# Patient Record
Sex: Female | Born: 1965
Health system: Southern US, Community
[De-identification: ages and names within clinical notes are randomized; demographics above are authoritative.]

## PROBLEM LIST (undated history)

## (undated) DIAGNOSIS — G319 Degenerative disease of nervous system, unspecified: Secondary | ICD-10-CM

---

## 2013-02-16 ENCOUNTER — Ambulatory Visit (INDEPENDENT_AMBULATORY_CARE_PROVIDER_SITE_OTHER): Payer: 59 | Admitting: Emergency Medicine

## 2013-02-16 VITALS — BP 160/81 | HR 89 | Temp 98.8°F | Resp 17 | Ht 71.0 in | Wt 243.0 lb

## 2013-02-16 DIAGNOSIS — Z0000 Encounter for general adult medical examination without abnormal findings: Secondary | ICD-10-CM

## 2013-02-16 LAB — POCT UA - MICROSCOPIC ONLY

## 2013-02-16 LAB — COMPREHENSIVE METABOLIC PANEL
AST: 18 U/L (ref 0–37)
Alkaline Phosphatase: 36 U/L — ABNORMAL LOW (ref 39–117)
BUN: 9 mg/dL (ref 6–23)
Calcium: 9.9 mg/dL (ref 8.4–10.5)
Chloride: 103 mEq/L (ref 96–112)
Creat: 0.91 mg/dL (ref 0.50–1.10)

## 2013-02-16 LAB — POCT CBC
Granulocyte percent: 44.9 %G (ref 37–80)
MCV: 85.7 fL (ref 80–97)
MID (cbc): 0.2 (ref 0–0.9)
MPV: 8.6 fL (ref 0–99.8)
POC MID %: 5 %M (ref 0–12)
Platelet Count, POC: 320 10*3/uL (ref 142–424)
RBC: 4.87 M/uL (ref 4.04–5.48)

## 2013-02-16 LAB — LIPID PANEL
Cholesterol: 150 mg/dL (ref 0–200)
Total CHOL/HDL Ratio: 3 Ratio
Triglycerides: 94 mg/dL (ref <150)
VLDL: 19 mg/dL (ref 0–40)

## 2013-02-16 LAB — POCT URINALYSIS DIPSTICK
Glucose, UA: NEGATIVE
Leukocytes, UA: NEGATIVE
Nitrite, UA: NEGATIVE
Urobilinogen, UA: 0.2

## 2013-02-16 NOTE — Patient Instructions (Signed)

## 2013-02-16 NOTE — Progress Notes (Signed)
[facility] [address] [place] [address] [phone]  Date:  02/16/2013   Name:  Chelsea Winters   DOB:  [birth year]   MRN:  [id number]  PCP:  No primary provider on file.    Chief Complaint: Annual Exam   History of Present Illness:  Chelsea Winters is a 47 y.o. very pleasant female patient who presents with the following:  For wellness examination.  No current medications.  No current health concerns or complaints.  Has lost 16 pounds in past 2 months on diet.  There is no problem list on file for this patient.   History reviewed. No pertinent past medical history.  History reviewed. No pertinent past surgical history.  History  Substance Use Topics  . Smoking status: Never Smoker   . Smokeless tobacco: Not on file  . Alcohol Use: No    Family History  Problem Relation Age of Onset  . Diabetes Mother   . Cancer Mother   . Asthma Father   . Alzheimer's disease Maternal Grandmother   . Cancer Paternal Grandfather     No Known Allergies  Medication list has been reviewed and updated.  No current outpatient prescriptions on file prior to visit.   No current facility-administered medications on file prior to visit.    Review of Systems:  As per HPI, otherwise negative.    Physical Examination: Filed Vitals:   02/16/13 1152  BP: 160/81  Pulse: 89  Temp: 98.8 F (37.1 C)  Resp: 17   Filed Vitals:   02/16/13 1152  Height: 5\' 11"  (1.803 m)  Weight: 243 lb (110.224 kg)   Body mass index is 33.91 kg/(m^2). Ideal Body Weight: Weight in (lb) to have BMI = 25: 178.9  GEN: WDWN, NAD, Non-toxic, A & O x 3 HEENT: Atraumatic, Normocephalic. Neck supple. No masses, No LAD. Ears and Nose: No external deformity. CV: RRR, No M/G/R. No JVD. No thrill. No extra heart sounds. PULM: CTA B, no wheezes, crackles, rhonchi. No retractions. No resp. distress. No accessory muscle use. ABD: S, NT, ND, +BS. No rebound. No HSM. EXTR: No  c/c/e NEURO Normal gait.  PSYCH: Normally interactive. Conversant. Not depressed or anxious appearing.  Calm demeanor.  Pelvic - normal external genitalia, vulva, vagina, cervix, uterus and adnexa   Assessment and Plan: Wellness examination Labs mammo PAP Follow up based on labs  Carmelina Dane, MD

## 2013-02-16 NOTE — Addendum Note (Signed)
Addended by: Johnnette Litter on: 02/16/2013 12:42 PM   Modules accepted: Orders

## 2013-02-25 ENCOUNTER — Ambulatory Visit (HOSPITAL_COMMUNITY)
Admission: RE | Admit: 2013-02-25 | Discharge: 2013-02-25 | Disposition: A | Discharge status: Home / Self Care | Payer: Managed Care, Other (non HMO) | Source: Ambulatory Visit | Attending: Emergency Medicine | Admitting: Emergency Medicine

## 2013-02-25 DIAGNOSIS — Z1231 Encounter for screening mammogram for malignant neoplasm of breast: Secondary | ICD-10-CM | POA: Insufficient documentation

## 2013-02-27 ENCOUNTER — Other Ambulatory Visit: Payer: Self-pay | Admitting: Emergency Medicine

## 2013-02-27 ENCOUNTER — Telehealth: Payer: Self-pay | Admitting: Radiology

## 2013-02-27 ENCOUNTER — Other Ambulatory Visit (INDEPENDENT_AMBULATORY_CARE_PROVIDER_SITE_OTHER): Payer: Self-pay | Admitting: Surgery

## 2013-02-27 DIAGNOSIS — R928 Other abnormal and inconclusive findings on diagnostic imaging of breast: Secondary | ICD-10-CM

## 2013-02-27 NOTE — Telephone Encounter (Signed)
done

## 2013-02-27 NOTE — Telephone Encounter (Signed)
Message copied by Caffie Damme on Wed Feb 27, 2013  9:51 AM ------      Message from: Sharion Dove      Created: Wed Feb 27, 2013  8:53 AM      Regarding: EPIC ORDER REQUEST FOR ADDITIONAL IMAGING       THIS PATIENT IS NEEDING ADDITIONAL IMAGING AT THE [facility].            PROCEDURE CODE: [id number]      DIAGNOSIS: ABNORMAL MAMMOGRAM            AND            PROCEDURE CODE: [id number]      DIAGNOSIS: ABNORMAL MAMMOGRAM            THANK YOU. ------

## 2013-03-04 ENCOUNTER — Telehealth: Payer: Self-pay | Admitting: Radiology

## 2013-03-04 DIAGNOSIS — R928 Other abnormal and inconclusive findings on diagnostic imaging of breast: Secondary | ICD-10-CM

## 2013-03-04 NOTE — Telephone Encounter (Signed)
Message copied by Caffie Damme on Mon Mar 04, 2013  1:08 PM ------      Message from: Sharion Dove      Created: Wed Feb 27, 2013 11:12 AM      Regarding: CHANGE OF EPIC ORDER REQUEST       I'VE REQUESTED THE WRONG PROCEDURE FOR THIS PATIENT. THE DIAGNOSTIC MAMMOGRAM IS CORRECT BUT THE ULTRASOUND SHOULD BE FOR THE RIGHT. HERE IS THE CORRECT PROCEDURE CODE            PROCEDURE: [id number]      DIAGNOSIS: ABNORMAL MAMMOGRAM                  THANK YOU. ------

## 2013-03-08 ENCOUNTER — Ambulatory Visit
Admission: RE | Admit: 2013-03-08 | Discharge: 2013-03-08 | Disposition: A | Discharge status: Home / Self Care | Payer: Managed Care, Other (non HMO) | Source: Ambulatory Visit | Attending: Emergency Medicine | Admitting: Emergency Medicine

## 2013-03-08 ENCOUNTER — Other Ambulatory Visit: Payer: Managed Care, Other (non HMO)

## 2013-03-08 DIAGNOSIS — R928 Other abnormal and inconclusive findings on diagnostic imaging of breast: Secondary | ICD-10-CM

## 2013-12-20 ENCOUNTER — Ambulatory Visit (INDEPENDENT_AMBULATORY_CARE_PROVIDER_SITE_OTHER): Payer: 59 | Admitting: Emergency Medicine

## 2013-12-20 VITALS — BP 144/88 | HR 92 | Temp 98.8°F | Resp 18 | Ht 70.5 in | Wt 260.0 lb

## 2013-12-20 DIAGNOSIS — Z0000 Encounter for general adult medical examination without abnormal findings: Secondary | ICD-10-CM

## 2013-12-20 LAB — POCT URINALYSIS DIPSTICK
Bilirubin, UA: NEGATIVE
Blood, UA: NEGATIVE
Glucose, UA: NEGATIVE
Ketones, UA: NEGATIVE
LEUKOCYTES UA: NEGATIVE
NITRITE UA: NEGATIVE
PH UA: 5.5
Spec Grav, UA: 1.025
UROBILINOGEN UA: 0.2

## 2013-12-20 LAB — POCT UA - MICROSCOPIC ONLY
CASTS, UR, LPF, POC: NEGATIVE
Crystals, Ur, HPF, POC: NEGATIVE
MUCUS UA: POSITIVE
YEAST UA: NEGATIVE

## 2013-12-20 NOTE — Addendum Note (Signed)
Addended by: Carollee LeitzMCFARLAND, Shahmeer Bunn L on: 12/20/2013 07:19 PM   Modules accepted: Orders

## 2013-12-20 NOTE — Progress Notes (Signed)
[facility] [address], [place] [id number] [phone]  Date:  12/20/2013   Name:  Orlando PennerBernadette Camilo   DOB:  June 10, 1966   MRN:  [id number]  PCP:  No PCP Per Patient    Chief Complaint: Annual Exam   History of Present Illness:  Orlando PennerBernadette Kimoto is a 48 y.o. very pleasant female patient who presents with the following:  For wellness exam required by employer.  No medications other than OTC vitamins.  Non smoker.  Works as a Museum/gallery exhibitions officermedical transcriptionist.  Is up to date on PAP and Mammo.  Denies other complaint or health concern today.   There are no active problems to display for this patient.   History reviewed. No pertinent past medical history.  No past surgical history on file.  History  Substance Use Topics  . Smoking status: Never Smoker   . Smokeless tobacco: Not on file  . Alcohol Use: No    Family History  Problem Relation Age of Onset  . Diabetes Mother   . Cancer Mother   . Asthma Father   . Alzheimer's disease Maternal Grandmother   . Cancer Paternal Grandfather     No Known Allergies  Medication list has been reviewed and updated.  No current outpatient prescriptions on file prior to visit.   No current facility-administered medications on file prior to visit.    Review of Systems:  As per HPI, otherwise negative.    Physical Examination: Filed Vitals:   12/20/13 1840  BP: 144/88  Pulse: 92  Temp: 98.8 F (37.1 C)  Resp: 18   Filed Vitals:   12/20/13 1840  Height: 5' 10.5" (1.791 m)  Weight: 260 lb (117.935 kg)   Body mass index is 36.77 kg/(m^2). Ideal Body Weight: Weight in (lb) to have BMI = 25: 176.4  GEN: obese, NAD, Non-toxic, A & O x 3 HEENT: Atraumatic, Normocephalic. Neck supple. No masses, No LAD. Ears and Nose: No external deformity. CV: RRR, No M/G/R. No JVD. No thrill. No extra heart sounds. PULM: CTA B, no wheezes, crackles, rhonchi. No retractions. No resp. distress. No accessory muscle  use. ABD: S, NT, ND, +BS. No rebound. No HSM. EXTR: No c/c/e NEURO Normal gait.  PSYCH: Normally interactive. Conversant. Not depressed or anxious appearing.  Calm demeanor.    Assessment and Plan: Wellness examination Weight loss   Signed,  Phillips OdorJeffery Anderson, MD

## 2013-12-21 LAB — LIPID PANEL
Cholesterol: 195 mg/dL (ref 0–200)
HDL: 64 mg/dL (ref >39)
LDL CALC: 90 mg/dL (ref 0–99)
TRIGLYCERIDES: 206 mg/dL — AB (ref <150)
Total CHOL/HDL Ratio: 3 Ratio
VLDL: 41 mg/dL — ABNORMAL HIGH (ref 0–40)

## 2013-12-21 LAB — COMPREHENSIVE METABOLIC PANEL
ALBUMIN: 4.4 g/dL (ref 3.5–5.2)
ALT: 23 U/L (ref 0–35)
AST: 25 U/L (ref 0–37)
Alkaline Phosphatase: 34 U/L — ABNORMAL LOW (ref 39–117)
BILIRUBIN TOTAL: 0.6 mg/dL (ref 0.3–1.2)
BUN: 7 mg/dL (ref 6–23)
CALCIUM: 9.6 mg/dL (ref 8.4–10.5)
CHLORIDE: 100 meq/L (ref 96–112)
CO2: 28 meq/L (ref 19–32)
Creat: 0.68 mg/dL (ref 0.50–1.10)
GLUCOSE: 91 mg/dL (ref 70–99)
Potassium: 4.5 mEq/L (ref 3.5–5.3)
SODIUM: 135 meq/L (ref 135–145)
TOTAL PROTEIN: 7.5 g/dL (ref 6.0–8.3)

## 2013-12-23 ENCOUNTER — Telehealth: Payer: Self-pay

## 2013-12-23 NOTE — Telephone Encounter (Signed)
Pt notified of cholesterol levels.

## 2013-12-23 NOTE — Telephone Encounter (Signed)
PT STATES WE CALLED AND GAVE HER HER LAB RESULTS, BUT SHE WOULD LIKE TO KNOW WHAT HER CHOL WAS. PLEASE CALL [phone]

## 2014-04-05 ENCOUNTER — Ambulatory Visit: Payer: Managed Care, Other (non HMO)

## 2014-04-05 ENCOUNTER — Ambulatory Visit: Payer: 59 | Admitting: Family Medicine

## 2014-04-05 VITALS — BP 144/86 | HR 77 | Temp 98.4°F | Resp 16 | Ht 70.5 in | Wt 259.0 lb

## 2014-04-05 DIAGNOSIS — R0689 Other abnormalities of breathing: Principal | ICD-10-CM

## 2014-04-05 DIAGNOSIS — R0609 Other forms of dyspnea: Secondary | ICD-10-CM

## 2014-04-05 DIAGNOSIS — R0600 Dyspnea, unspecified: Secondary | ICD-10-CM

## 2014-04-05 DIAGNOSIS — J309 Allergic rhinitis, unspecified: Secondary | ICD-10-CM

## 2014-04-05 DIAGNOSIS — R0989 Other specified symptoms and signs involving the circulatory and respiratory systems: Secondary | ICD-10-CM

## 2014-04-05 MED ORDER — FLUTICASONE PROPIONATE 50 MCG/ACT NA SUSP: 2.0000 | Freq: Every day | NASAL | Status: AC

## 2014-04-05 NOTE — Patient Instructions (Signed)
Allergic Rhinitis Allergic rhinitis is when the mucous membranes in the nose respond to allergens. Allergens are particles in the air that cause your body to have an allergic reaction. This causes you to release allergic antibodies. Through a chain of events, these eventually cause you to release histamine into the blood stream. Although meant to protect the body, it is this release of histamine that causes your discomfort, such as frequent sneezing, congestion, and an itchy, runny nose.  CAUSES  Seasonal allergic rhinitis (hay fever) is caused by pollen allergens that may come from grasses, trees, and weeds. Year-round allergic rhinitis (perennial allergic rhinitis) is caused by allergens such as house dust mites, pet dander, and mold spores.  SYMPTOMS   Nasal stuffiness (congestion).  Itchy, runny nose with sneezing and tearing of the eyes. DIAGNOSIS  Your health care provider can help you determine the allergen or allergens that trigger your symptoms. If you and your health care provider are unable to determine the allergen, skin or blood testing may be used. TREATMENT  Allergic Rhinitis does not have a cure, but it can be controlled by:  Medicines and allergy shots (immunotherapy).  Avoiding the allergen. Hay fever may often be treated with antihistamines in pill or nasal spray forms. Antihistamines block the effects of histamine. There are over-the-counter medicines that may help with nasal congestion and swelling around the eyes. Check with your health care provider before taking or giving this medicine.  If avoiding the allergen or the medicine prescribed do not work, there are many new medicines your health care provider can prescribe. Stronger medicine may be used if initial measures are ineffective. Desensitizing injections can be used if medicine and avoidance does not work. Desensitization is when a patient is given ongoing shots until the body becomes less sensitive to the allergen.  Make sure you follow up with your health care provider if problems continue. HOME CARE INSTRUCTIONS It is not possible to completely avoid allergens, but you can reduce your symptoms by taking steps to limit your exposure to them. It helps to know exactly what you are allergic to so that you can avoid your specific triggers. SEEK MEDICAL CARE IF:   You have a fever.  You develop a cough that does not stop easily (persistent).  You have shortness of breath.  You start wheezing.  Symptoms interfere with normal daily activities. Document Released: 08/30/2001 Document Revised: 09/25/2013 Document Reviewed: 08/12/2013 ExitCare Patient Information 2014 [employer].  

## 2014-04-05 NOTE — Progress Notes (Addendum)
° °  Subjective:   This chart was scribed for Chelsea SidleKurt Lauenstein, MD by Arlan OrganAshley Leger, [facility] Sc[facility]. This patient was seen in room 3 and the patient's care was started 10:21 AM.    Patient ID: Chelsea Winters, female    DOB: [birth year], 48 y.o.   MRN: [id number]  Chief Complaint  Patient presents with   Shortness of Breath    x1 week worse today    nervous feeling   Diarrhea    HPI  HPI Comments: Chelsea Winters is a 48 y.o. female who [facility] complaining of intermittent shortness of breath x 1 week that has progressively worsened today. She also reports anxiety, diarrhea, "feeling constricted", and a "nervous feeling". States she has recently started working out, but says she still noted "not being able to get a complete breath" prior to starting her low impact cardio routines. Pt states she was treated for Asthma in 1997 and has not needed her inhaler since. Denies currently being a smoker. At this time she denies any wheezing, fever, chills, or weakness. No pertinent medical history. No other concerns this visit.  She currently has two children ages 8615 and 4317 Pt works as a Museum/gallery exhibitions officermedical transcriptionist    History reviewed. No pertinent past medical history.  History   Social History   Marital Status: Single    Spouse Name: N/A    Number of Children: N/A   Years of Education: N/A   Occupational History   Not on file.   Social History Main Topics   Smoking status: Never Smoker    Smokeless tobacco: Not on file   Alcohol Use: No   Drug Use: No   Sexual Activity: No   Other Topics Concern   Not on file   Social History Narrative   No narrative on file    Review of Systems  Constitutional: Negative for fever and chills.  HENT: Negative for congestion.   Eyes: Negative for redness.  Respiratory: Positive for shortness of breath. Negative for cough.   Gastrointestinal: Positive for diarrhea.  Skin: Negative  for rash.  Psychiatric/Behavioral: Negative for confusion. The patient is nervous/anxious.        Objective:   Physical Exam  Nursing note and vitals reviewed. Constitutional: She is oriented to person, place, and time. She appears well-developed and well-nourished.  HENT:  Head: Normocephalic and atraumatic.  Nose: Mucosal edema present.  Eyes: EOM are normal.  Neck: Normal range of motion.  Cardiovascular: Normal rate.   Pulmonary/Chest: Effort normal.  Musculoskeletal: Normal range of motion.  Neurological: She is alert and oriented to person, place, and time.  Skin: Skin is warm and dry.  Psychiatric: She has a normal mood and affect. Her behavior is normal.   EKG: Normal sinus rhythm  Pulmonary function:  normal    [facility] reading (PRIMARY) by  Dr. Milus GlazierLauenstein  CXR:  neg.   Assessment & Plan:   I personally performed the services described in this documentation, which was scribed in my presence. The recorded information has been reviewed and is accurate.  Dyspnea and respiratory abnormality - Plan: DG Chest 2 View, EKG 12-Lead, EKG 12-Lead, Pulmonary Function Test, Pulmonary Function Test, fluticasone (FLONASE) 50 MCG/ACT nasal spray  Allergic rhinitis - Plan: fluticasone (FLONASE) 50 MCG/ACT nasal spray  Signed, Chelsea SidleKurt Lauenstein, MD

## 2014-07-03 ENCOUNTER — Ambulatory Visit (INDEPENDENT_AMBULATORY_CARE_PROVIDER_SITE_OTHER): Payer: 59

## 2014-07-03 ENCOUNTER — Ambulatory Visit (INDEPENDENT_AMBULATORY_CARE_PROVIDER_SITE_OTHER): Payer: 59 | Admitting: Family Medicine

## 2014-07-03 ENCOUNTER — Encounter: Payer: Self-pay | Admitting: Family Medicine

## 2014-07-03 VITALS — BP 130/80 | HR 72 | Temp 98.4°F | Resp 16 | Ht 70.0 in | Wt 250.8 lb

## 2014-07-03 DIAGNOSIS — M25519 Pain in unspecified shoulder: Secondary | ICD-10-CM

## 2014-07-03 DIAGNOSIS — R202 Paresthesia of skin: Secondary | ICD-10-CM

## 2014-07-03 DIAGNOSIS — M62830 Muscle spasm of back: Secondary | ICD-10-CM

## 2014-07-03 DIAGNOSIS — R209 Unspecified disturbances of skin sensation: Secondary | ICD-10-CM

## 2014-07-03 DIAGNOSIS — M5380 Other specified dorsopathies, site unspecified: Secondary | ICD-10-CM

## 2014-07-03 DIAGNOSIS — M25511 Pain in right shoulder: Secondary | ICD-10-CM

## 2014-07-03 DIAGNOSIS — E669 Obesity, unspecified: Secondary | ICD-10-CM | POA: Insufficient documentation

## 2014-07-03 MED ORDER — TRAMADOL-ACETAMINOPHEN 37.5-325 MG PO TABS: 1.0000 | ORAL_TABLET | Freq: Four times a day (QID) | ORAL | Status: DC | PRN

## 2014-07-03 MED ORDER — CYCLOBENZAPRINE HCL 5 MG PO TABS: 5.0000 mg | ORAL_TABLET | Freq: Three times a day (TID) | ORAL | Status: DC | PRN

## 2014-07-03 NOTE — Progress Notes (Signed)
Subjective:    Patient ID: Chelsea Winters, female    DOB: 1966/09/15, 48 y.o.   MRN: [id number]  HPI  This 48 y.o. AA female is here for evaluation of R shoulder and mid-back pain. She reports sudden onset and denies trauma. Pt is R-hand dominant. 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 Pain is relieved when her R arm is hanging at her side. She does not have weakness or decreased grip on R side. She denies neck or back pain or trauma. OTC NSAIDs and heat have provided temporary relief.  PMHx, Surg Hx, Soc and Fam Hx reviewed.   Review of Systems  Constitutional: Negative.   Respiratory: Negative.   Cardiovascular: Negative.   Musculoskeletal: Positive for arthralgias. Negative for joint swelling, myalgias, neck pain and neck stiffness.  Neurological: Negative.   Psychiatric/Behavioral: Negative.       Objective:   Physical Exam  Nursing note and vitals reviewed. Constitutional: She is oriented to person, place, and time. She appears well-developed and well-nourished. No distress.  HENT:  Head: Normocephalic and atraumatic.  Right Ear: External ear normal.  Left Ear: External ear normal.  Nose: Nose normal.  Mouth/Throat: Oropharynx is clear and moist.  Eyes: Conjunctivae and EOM are normal. Pupils are equal, round, and reactive to light. No scleral icterus.  Neck: Normal range of motion. Neck supple. No thyromegaly present.  Cardiovascular: Normal rate, regular rhythm and normal heart sounds.  Exam reveals no gallop and no friction rub.   No murmur heard. Pulmonary/Chest: Effort normal and breath sounds normal. No respiratory distress.  Musculoskeletal:       Right shoulder: She exhibits tenderness and pain. She exhibits normal range of motion, no bony tenderness, no effusion, no crepitus, no deformity and normal strength.       Left shoulder: Normal.       Cervical back:  Normal.       Thoracic back: Normal.  Neurological: She is alert and oriented to person, place, and time. She has normal strength. She displays no atrophy. No cranial nerve deficit or sensory deficit. She exhibits normal muscle tone. Coordination and gait normal.  Reflex Scores:      Tricep reflexes are 1+ on the right side and 1+ on the left side.      Bicep reflexes are 1+ on the right side and 1+ on the left side.      Brachioradialis reflexes are 1+ on the right side and 1+ on the left side. Skin: Skin is warm, dry and intact. No rash noted. She is not diaphoretic. No erythema.  Psychiatric: She has a normal mood and affect. Her behavior is normal. Judgment and thought content normal.    [facility] reading (PRIMARY) by  Dr. Audria Nine: Thoracic spine- early degenerative changes and scoliosis vs. pt rotated. No bony abnormalities, fracture or dislocation.     Assessment & Plan:  Pain in joint, shoulder region, right - Plan: DG Thoracic Spine 2 View, Ambulatory referral to Physical Therapy  Spasm of back muscles - Plan: Ambulatory referral to Physical Therapy  Paresthesia of right arm  Meds ordered this encounter  . cyclobenzaprine (FLEXERIL) 5 MG tablet    Sig: Take 1 tablet (5 mg total) by mouth 3 (three) times daily as needed for muscle spasms.    Dispense:  30 tablet    Refill:  1  . traMADol-acetaminophen (ULTRACET) 37.5-325 MG per tablet  Sig: Take 1 tablet by mouth every 6 (six) hours as needed.    Dispense:  50 tablet    Refill:  0

## 2014-07-03 NOTE — Patient Instructions (Signed)

## 2014-07-08 ENCOUNTER — Ambulatory Visit (INDEPENDENT_AMBULATORY_CARE_PROVIDER_SITE_OTHER): Payer: Managed Care, Other (non HMO)

## 2014-07-08 ENCOUNTER — Encounter: Payer: Self-pay | Admitting: Family Medicine

## 2014-07-08 ENCOUNTER — Ambulatory Visit (INDEPENDENT_AMBULATORY_CARE_PROVIDER_SITE_OTHER): Payer: Managed Care, Other (non HMO) | Admitting: Family Medicine

## 2014-07-08 VITALS — BP 130/80 | HR 79 | Temp 99.0°F | Resp 16 | Ht 70.5 in | Wt 253.2 lb

## 2014-07-08 DIAGNOSIS — M25519 Pain in unspecified shoulder: Secondary | ICD-10-CM

## 2014-07-08 DIAGNOSIS — M25511 Pain in right shoulder: Secondary | ICD-10-CM

## 2014-07-08 DIAGNOSIS — R209 Unspecified disturbances of skin sensation: Secondary | ICD-10-CM

## 2014-07-08 DIAGNOSIS — M5030 Other cervical disc degeneration, unspecified cervical region: Secondary | ICD-10-CM

## 2014-07-08 DIAGNOSIS — IMO0001 Reserved for inherently not codable concepts without codable children: Secondary | ICD-10-CM

## 2014-07-08 NOTE — Patient Instructions (Signed)
Cervical Radiculopathy °Cervical radiculopathy means a nerve in the neck is pinched or bruised. This can cause pain or loss of feeling (numbness) that runs from your neck to your arm and fingers. °HOME CARE  °· Put ice on the injured or painful area. °¨ Put ice in a plastic bag. °¨ Place a towel between your skin and the bag. °¨ Leave the ice on for 15-20 minutes, 03-04 times a day, or as told by your doctor. °· If ice does not help, you can try using heat. Take a warm shower or bath, or use a hot water bottle as told by your doctor. °· You may try a gentle neck and shoulder massage. °· Use a flat pillow when you sleep. °· Only take medicines as told by your doctor. °· Keep all physical therapy visits as told by your doctor. °· If you are given a soft collar, wear it as told by your doctor. °GET HELP RIGHT AWAY IF:  °· Your pain gets worse and is not controlled with medicine. °· You lose feeling or feel weak in your hand, arm, face, or leg. °· You have a fever or stiff neck. °· You cannot control when you poop or pee (incontinence). °· You have trouble with walking, balance, or speaking. °MAKE SURE YOU:  °· Understand these instructions. °· Will watch your condition. °· Will get help right away if you are not doing well or get worse. °Document Released: 11/24/2011 Document Revised: 02/27/2012 Document Reviewed: 11/24/2011 °ExitCare® Patient Information ©2015 [employer]. This information is not intended to replace advice given to you by your health care provider. Make sure you discuss any questions you have with your health care provider. ° °

## 2014-07-08 NOTE — Progress Notes (Signed)
S;  This 48 y.o. AA female is here for follow-up re: R shoulder region pain w/ paresthesias down back of R arm. She has been taking medications as prescribed and was pain free last night. Topical heat has been effective. 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 She has not started PT yet but did get a phone call; she will be setting up appt for evaluation and treatment.  Patient Active Problem List   Diagnosis Date Noted  . Pain in joint, shoulder region 07/03/2014  . Obesity (BMI 35.0-39.9 without comorbidity) 07/03/2014   Prior to Admission medications   Medication Sig Start Date End Date Taking? Authorizing Provider  cyclobenzaprine (FLEXERIL) 5 MG tablet Take 1 tablet (5 mg total) by mouth 3 (three) times daily as needed for muscle spasms. 07/03/14  Yes Maurice MarchBarbara B Teagon Kron, MD  esomeprazole (NEXIUM) 20 MG capsule Take 20 mg by mouth daily at 12 noon.   Yes Historical Provider, MD  fluticasone (FLONASE) 50 MCG/ACT nasal spray Place 2 sprays into both nostrils daily. 04/05/14  Yes Elvina SidleKurt Lauenstein, MD  traMADol-acetaminophen (ULTRACET) 37.5-325 MG per tablet Take 1 tablet by mouth every 6 (six) hours as needed. 07/03/14  Yes Maurice MarchBarbara B Eryn Krejci, MD  b complex vitamins tablet Take 1 tablet by mouth daily.    Historical Provider, MD  glucosamine-chondroitin 500-400 MG tablet Take 1 tablet by mouth 3 (three) times daily.    Historical Provider, MD  loratadine-pseudoephedrine (CLARITIN-D 12-HOUR) 5-120 MG per tablet Take 1 tablet by mouth 2 (two) times daily.    Historical Provider, MD  Multiple Vitamin (MULTIVITAMIN) capsule Take 1 capsule by mouth daily.    Historical Provider, MD  OVER THE COUNTER MEDICATION     Historical Provider, MD  OVER THE COUNTER MEDICATION 1,000 mg 2 (two) times daily before lunch and supper.    Historical Provider, MD    ROS: As per HPI. She notes tightness in R side of neck with flexion and rotation.   O: Filed Vitals:   07/08/14 1338   BP: 130/80  Pulse: 79  Temp: 99 F (37.2 C)  Resp: 16   GEN: In NAD; WN,WD. HENT: [place]/AT; EOMI w/ clear conj/sclerae. Otherwise unremarkable. NECK: Decreased ROM. Tender posterior muscle group on R. COR: RRR. LUNGS: Unlabored resp. MS: Normal ROM in shoulders. No change in exam compared to 07/03/2014. NEURO: A&O x 3; CNs intact. Gait is normal. Nonfocal.   [facility] reading (PRIMARY) by  Dr. Audria NineMcPherson: Cervical spine-  Loss of normal lordotic curve; degenerative changes at C4-C5 and less so at C5-C6. No fracture of dislocation.   A/P: Pain in joint, shoulder region, right - PT evaluation and treatment plan pending. Plan: DG Cervical Spine 2 or 3 views  Paresthesias/numbness - Plan: DG Cervical Spine 2 or 3 views  DDD (degenerative disc disease), cervical- Continue current medications and heat.  Pt given work excuse for 1-2 weeks; she will return to clinic in 3 weeks or sooner prn.

## 2014-07-17 ENCOUNTER — Other Ambulatory Visit: Payer: Self-pay | Admitting: Family Medicine

## 2014-07-19 NOTE — Telephone Encounter (Signed)
Tramadol- APAP refill phoned to pt's pharmacy. 

## 2014-07-21 ENCOUNTER — Telehealth: Payer: Self-pay

## 2014-07-21 NOTE — Telephone Encounter (Signed)
Pt needs Dr Audria NineMcPherson to extend her work excuse till 07-31-14. It expires 07-22-14. If someone could call her when it is done. She needs it asap due to it expiring tomorrow. [phone]. Thank you

## 2014-07-22 ENCOUNTER — Telehealth: Payer: Self-pay

## 2014-07-22 ENCOUNTER — Encounter: Payer: Self-pay | Admitting: Family Medicine

## 2014-07-22 NOTE — Telephone Encounter (Signed)
Called her to advise.  

## 2014-07-22 NOTE — Telephone Encounter (Signed)
A new letter has been printed for pt and is ready for pick-up at [facility]. Please contact pt about this work note.

## 2014-07-22 NOTE — Telephone Encounter (Signed)
Pt requesting OOW for another 10 days.

## 2014-07-22 NOTE — Telephone Encounter (Signed)
Pt dropped off FMLA ppw for Dr. Audria NineMcPherson to be completed in 5-7 business days

## 2014-07-29 NOTE — Telephone Encounter (Signed)
FMLA form completed and returned to Disability Desk on Monday, 07/28/14.

## 2014-07-31 ENCOUNTER — Ambulatory Visit (INDEPENDENT_AMBULATORY_CARE_PROVIDER_SITE_OTHER): Payer: Managed Care, Other (non HMO) | Admitting: Family Medicine

## 2014-07-31 ENCOUNTER — Encounter: Payer: Self-pay | Admitting: Family Medicine

## 2014-07-31 VITALS — BP 134/76 | HR 74 | Temp 99.1°F | Resp 16 | Ht 70.0 in | Wt 246.0 lb

## 2014-07-31 DIAGNOSIS — M5030 Other cervical disc degeneration, unspecified cervical region: Secondary | ICD-10-CM

## 2014-07-31 NOTE — Progress Notes (Signed)
S:  This 48 y.o. AA female returns for re- evaluation of neck and R shoulder pain with paresthesia. She is using less medication but still has decreased ROM. She has numbness when her R arm is above waist level. Numbness occurs when she is trying to drive or floss her teeth. 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 Medication is effective but cause drowsiness; her supervisor does not want her trying to transcribe if she is under the influence of prescription medication. Pt has been going to PT; it seems to be effective.  Patient Active Problem List   Diagnosis Date Noted  . DDD (degenerative disc disease), cervical 07/08/2014  . Pain in joint, shoulder region 07/03/2014  . Obesity (BMI 35.0-39.9 without comorbidity) 07/03/2014    Prior to Admission medications   Medication Sig Start Date End Date Taking? Authorizing Provider  b complex vitamins tablet Take 1 tablet by mouth daily.   Yes Historical Provider, MD  cyclobenzaprine (FLEXERIL) 5 MG tablet Take 1 tablet (5 mg total) by mouth 3 (three) times daily as needed for muscle spasms. 07/03/14  Yes Maurice March, MD  fluticasone [facility]) 50 MCG/ACT nasal spray Place 2 sprays into both nostrils daily. 04/05/14  Yes Elvina Sidle, MD  glucosamine-chondroitin 500-400 MG tablet Take 1 tablet by mouth 3 (three) times daily.   Yes Historical Provider, MD  loratadine-pseudoephedrine (CLARITIN-D 12-HOUR) 5-120 MG per tablet Take 1 tablet by mouth 2 (two) times daily.   Yes Historical Provider, MD  Multiple Vitamin (MULTIVITAMIN) capsule Take 1 capsule by mouth daily.   Yes Historical Provider, MD  OVER THE COUNTER MEDICATION    Yes Historical Provider, MD  OVER THE COUNTER MEDICATION 1,000 mg 2 (two) times daily before lunch and supper.   Yes Historical Provider, MD  traMADol-acetaminophen (ULTRACET) 37.5-325 MG per tablet TAKE 1 TABLET BY MOUTH EVERY 6 HOURS AS NEEDED    Yes Maurice March, MD    No Known Allergies   History reviewed. No pertinent past surgical history.   Family History  Problem Relation Age of Onset  . Diabetes Mother   . Cancer Mother   . Asthma Father   . Alzheimer's disease Maternal Grandmother   . Cancer Paternal Grandfather     History   Social History  . Marital Status: Single    Spouse Name: N/A    Number of Children: N/A  . Years of Education: N/A   Occupational History  . Not on file.   Social History Main Topics  . Smoking status: Never Smoker   . Smokeless tobacco: Not on file  . Alcohol Use: No  . Drug Use: No  . Sexual Activity: No   Other Topics Concern  . Not on file   Social History Narrative  . No narrative on file    O: Filed Vitals:   07/31/14 1148  BP: 134/76  Pulse: 74  Temp: 99.1 F (37.3 C)  Resp: 16   GEN: In NAD; WN,WD. HENT: [place]/AT; EOMI w/ clear conj/sclerae. Otherwise unremarkable. NECK: Spasms. (Reviewed C-spine films w/ pt). COR: RRR. LUNGS: Normal resp rate and effort. SKIN: W&D; intact w/o diaphoresis or erythema. MS: MAEs; no deformities or muscle atrophy. NEURO: A&O x 3; CNs intact. Gait is normal. Nonfocal.  A/P: DDD (degenerative disc disease), cervical - Plan: MR Cervical Spine Wo Contrast Continue medication and PT; discussed referral to specialist if warranted. Pt  to remain out of work for now. FMLA papers have been completed and pt to pick them up from 102 today.

## 2014-08-01 DIAGNOSIS — Z0271 Encounter for disability determination: Secondary | ICD-10-CM

## 2014-08-04 ENCOUNTER — Telehealth: Payer: Self-pay | Admitting: Family Medicine

## 2014-08-04 NOTE — Telephone Encounter (Signed)
I have the FMLA forms, I had advised patient the ones we received were STD forms but we still needed the FMLA forms. Patient was aware when she picked up the STD forms we still needed the FMLA. I have them now, and have began filling them out, but have questions for Dr Audria NineMcPherson, will ask her tomorrow and let patient know when they are ready.  Dr Audria NineMcPherson, you have her out until 07/31/14, do you want to allow for additional time off for flare ups on the FMLA forms? They are on your desk with a note. See me if you have questions. ( I have also printed the STD forms you previously filled out.

## 2014-08-04 NOTE — Telephone Encounter (Signed)
Patient came to walk in center at 102 stating that her FMLA form was not completed. States that almost 2 weeks ago she brought in a short term disability form and an FMLA form to be done by Dr. Audria NineMcPherson. States that she picked the ppw up "around the 9th" and did not realize until today that her FMLA was not completed. Patient is asking for this FMLA form to be expedited if possible because her job is dependent on this. Informed patient that I will deliver her FMLA form to Dr. Audria NineMcPherson and either myself or Leotis ShamesLauren will call when this is ready for pick up.

## 2014-08-09 ENCOUNTER — Ambulatory Visit
Admission: RE | Admit: 2014-08-09 | Discharge: 2014-08-09 | Disposition: A | Discharge status: Home / Self Care | Payer: Managed Care, Other (non HMO) | Source: Ambulatory Visit | Attending: Family Medicine | Admitting: Family Medicine

## 2014-08-09 DIAGNOSIS — M5030 Other cervical disc degeneration, unspecified cervical region: Secondary | ICD-10-CM

## 2014-08-13 ENCOUNTER — Telehealth: Payer: Self-pay | Admitting: Family Medicine

## 2014-08-13 DIAGNOSIS — M4802 Spinal stenosis, cervical region: Secondary | ICD-10-CM

## 2014-08-13 DIAGNOSIS — M5030 Other cervical disc degeneration, unspecified cervical region: Secondary | ICD-10-CM

## 2014-08-13 NOTE — Telephone Encounter (Signed)
I have completed FMLA paperwork. (I explained to pt that I was out of office last week). FMLA should be returned to 102 on 08/14/2014.  I spoke with pt tonight about MRI result; order placed for neurosurgeon evaluation. Pt understands.

## 2014-08-13 NOTE — Telephone Encounter (Signed)
I spoke with pt re: MRI report. She agrees to referral to neurosurgeon for further evaluation. She will continue PT and home exercises. Pt would like copy of MRI report mailed to her.  FMLA form is complete as of 08/13/2014. To be returned to [address] building.

## 2014-08-14 ENCOUNTER — Encounter: Payer: Self-pay | Admitting: Family Medicine

## 2014-08-14 NOTE — Telephone Encounter (Signed)
Called patient to inform her that her paperwork has been completed and she may pick those up from 102. Patient has also requested a copy of her MRI results? Is it possible she can get those when she comes to get her FMLA forms?   Thanks, [employer]

## 2014-08-14 NOTE — Telephone Encounter (Signed)
Printed off a copy of MRI results and put with FMLA paperwork for pick up.

## 2014-08-27 ENCOUNTER — Telehealth: Payer: Self-pay

## 2014-08-27 NOTE — Telephone Encounter (Signed)
Patient has appt with Neurosurgeon today, I have written note for her outlining her restrictions, will have Dr Audria Nine sign. Printed MRI report, placed with the medical records you provided.

## 2014-08-27 NOTE — Telephone Encounter (Signed)
Pt came in 08/26/14, needed medical records from 07/03/2014 to present and a letter from Dr. Audria Nine stating her restrictions and limitations. Release signed by patient. Records printed and given to Amy at [employer] with instructions for letter needed by Dr. Audria Nine. Pt to be called when completed and she will pick up. [name]

## 2014-08-28 ENCOUNTER — Telehealth: Payer: Self-pay

## 2014-08-28 NOTE — Telephone Encounter (Signed)
Letter and records ready for pickup, tried to call patient-no answer and no voicemail option.

## 2014-08-28 NOTE — Telephone Encounter (Signed)
Left voicemail for patient to pickup letter and medical records she requested, in pickup drawer.

## 2014-08-28 NOTE — Telephone Encounter (Signed)
I put this in your chair. I did not know if anything else needs to be done. I have called patient to pick up, but did advise her to wait until this afternoon in case anything else is needed.

## 2014-08-28 NOTE — Telephone Encounter (Signed)
[employer] sent paperwork for pt's FMLA needing additional information. Sent this Via inner office mail for completion in 5-7 business days. Please return to disability box at 102, next to check out.

## 2014-08-29 NOTE — Telephone Encounter (Signed)
I received [employer] form today; will complete and return to Disability box at 102 by Monday.

## 2014-09-01 NOTE — Telephone Encounter (Signed)
[employer] form completed today (09/01/14) and returned to [employer] Disability box.

## 2014-09-02 NOTE — Telephone Encounter (Signed)
pts additional information faxed to  [employer]

## 2015-01-22 ENCOUNTER — Ambulatory Visit: Payer: Medicaid Other | Attending: Physical Medicine and Rehabilitation

## 2015-01-22 DIAGNOSIS — M542 Cervicalgia: Secondary | ICD-10-CM | POA: Insufficient documentation

## 2015-01-22 DIAGNOSIS — M5410 Radiculopathy, site unspecified: Secondary | ICD-10-CM | POA: Insufficient documentation

## 2015-01-29 ENCOUNTER — Ambulatory Visit: Payer: Medicaid Other | Admitting: Physical Therapy

## 2015-01-29 DIAGNOSIS — M542 Cervicalgia: Secondary | ICD-10-CM | POA: Diagnosis not present

## 2015-02-09 ENCOUNTER — Ambulatory Visit: Payer: Medicaid Other

## 2015-02-09 DIAGNOSIS — M542 Cervicalgia: Secondary | ICD-10-CM

## 2015-02-09 DIAGNOSIS — M5410 Radiculopathy, site unspecified: Secondary | ICD-10-CM

## 2015-02-09 DIAGNOSIS — R6889 Other general symptoms and signs: Secondary | ICD-10-CM

## 2015-02-09 NOTE — Therapy (Signed)
[facility] [address] [place], [place] [address] [place], [place], [address] Phone: [phone]   Fax:  [phone]  Physical Therapy Treatment  Patient Details  Name: Chelsea Winters MRN: [id number] Date of Birth: [birth year] Referring Provider:  Claria Dice, MD  Encounter Date: 02/09/2015      PT End of Session - 02/09/15 1138    Visit Number 3   Date for PT Re-Evaluation 02/19/15   PT Start Time 1105   PT Stop Time 1203   PT Time Calculation (min) 58 min   Activity Tolerance Patient tolerated treatment well   Behavior During Therapy [facility] for tasks assessed/performed      History reviewed. No pertinent past medical history.  History reviewed. No pertinent past surgical history.  There were no vitals taken for this visit.  Visit Diagnosis:  Neck pain, bilateral  Radiculopathy of arm  Activity intolerance      Subjective Assessment - 02/09/15 1106    Symptoms Pt reports continue Rt UE pain.  Central neck pain with supine cervical retraction exercises.   Currently in Pain? Yes   Pain Score 7    Pain Location Arm   Pain Type Neuropathic pain   Pain Radiating Towards Rt elbow   Pain Onset More than a month ago   Pain Frequency Intermittent   Aggravating Factors  use of the Rt UE to open a bottle, computer use   Pain Relieving Factors stretch it out, ER of the Rt arm to behind the head, heat   Effect of Pain on Daily Activities not able to work    Multiple Pain Sites Yes   Pain Score 6   Pain Type Acute pain   Pain Location Neck   Pain Orientation Left;Right   Pain Descriptors / Indicators Constant;Aching   Pain Frequency Intermittent   Pain Onset Progressive          [facility] PT Assessment - 02/09/15 0001    Assessment   Medical Diagnosis neck pain   Onset Date 07/02/15   Next MD Visit 03/11/15   Precautions   Precautions None   Balance Screen   Has the patient fallen in the past 6 months No   Has the patient had a  decrease in activity level because of a fear of falling?  No   Is the patient reluctant to leave their home because of a fear of falling?  No   Home Environment   Living Enviornment Private residence   Prior Function   Level of Independence Independent with basic ADLs                  [facility] Adult PT Treatment/Exercise - 02/09/15 0001    Modalities   Modalities Electrical Stimulation;Moist Heat;Traction   Moist Heat Therapy   Number Minutes Moist Heat 15 Minutes   Moist Heat Location --  neck   Electrical Stimulation   Electrical Stimulation Location Rt neck and scapula   Electrical Stimulation Action interferential   Electrical Stimulation Parameters 15 minutes   Electrical Stimulation Goals Pain   Traction   Type of Traction Cervical   Min (lbs) 5   Max (lbs) 16   Hold Time 60   Rest Time 10   Time 15   Manual Therapy   Manual Therapy Myofascial release   Myofascial Release soft tissue elongation to Rt>Lt cervical paraspinals with passive rotation and C4-5 rotational mobs.  PT Long Term Goals - 02/09/15 1111    PT LONG TERM GOAL #3   Title report a 30% reduction in Rt UE pain to allow for computer use   Status On-going  No change in Rt UE pain               Plan - 02/09/15 1139    Clinical Impression Statement Pt with only 3 sessions of PT with focus on modalties.  Goals assessed today.  Pt is independent in postural modifications and compliant with current HEP.     Pt will benefit from skilled therapeutic intervention in order to improve on the following deficits Pain;Impaired UE functional use;Decreased activity tolerance   Clinical Impairments Affecting Rehab Potential Pt with limited attendance due to financial concerns.     PT Frequency 4x / week   PT Treatment/Interventions Moist Heat;Therapeutic activities;Therapeutic exercise;Traction;Ultrasound;Manual techniques;Neuromuscular re-education;Electrical Stimulation    PT Next Visit Plan renewal next session.  Give pt FOTO.  Continue traction, manual and modalities as needed.    Consulted and Agree with Plan of Care Patient        Problem List Patient Active Problem List   Diagnosis Date Noted  . DDD (degenerative disc disease), cervical 07/08/2014  . Pain in joint, shoulder region 07/03/2014  . Obesity (BMI 35.0-39.9 without comorbidity) 07/03/2014    Emet Rafanan, PT 02/09/2015, 11:59 AM  [facility] [address], [place] [address] [place], [place], [address] Phone: [phone]   Fax:  [phone]

## 2015-02-17 ENCOUNTER — Ambulatory Visit: Payer: Medicaid Other | Attending: Physical Medicine and Rehabilitation

## 2015-02-17 DIAGNOSIS — M5410 Radiculopathy, site unspecified: Secondary | ICD-10-CM | POA: Insufficient documentation

## 2015-02-17 DIAGNOSIS — R6889 Other general symptoms and signs: Secondary | ICD-10-CM

## 2015-02-17 DIAGNOSIS — M542 Cervicalgia: Secondary | ICD-10-CM | POA: Insufficient documentation

## 2015-02-17 NOTE — Therapy (Addendum)
[facility] [address], [place], [place], [address] Phone: [phone]   Fax:  [phone]  Physical Therapy Treatment  Patient Details  Name: Chelsea Winters MRN: [id number] Date of Birth: [birth year] Referring Provider:  Normajean Glasgow, MD  Encounter Date: 02/17/2015      PT End of Session - 02/17/15 1141    Visit Number 4   Date for PT Re-Evaluation 04/10/15  If pt decides to return after MD visit   PT Start Time 1110   PT Stop Time 1206   PT Time Calculation (min) 56 min   Activity Tolerance Patient tolerated treatment well   Behavior During Therapy [facility] for tasks assessed/performed      History reviewed. No pertinent past medical history.  History reviewed. No pertinent past surgical history.  There were no vitals taken for this visit.  Visit Diagnosis:  Neck pain, bilateral - Plan: PT plan of care cert/re-cert  Radiculopathy of arm - Plan: PT plan of care cert/re-cert  Activity intolerance - Plan: PT plan of care cert/re-cert      Subjective Assessment - 02/17/15 1122    Symptoms Pt has home TENS unit and will pursue a home traction unit for symptoms.  Pt goes to the MD 03/11/15.   Limitations Sitting   How long can you sit comfortably? 10-15 minutes max   Patient Stated Goals Reduce UE pain so that she can work   Currently in Pain? Yes   Pain Score 6    Pain Location Arm   Pain Orientation Right   Pain Descriptors / Indicators Stabbing;Aching   Pain Type Neuropathic pain   Pain Radiating Towards Rt elbow   Pain Onset More than a month ago   Pain Frequency Intermittent   Aggravating Factors  Use of Rt UE to open a botte, computer use   Pain Relieving Factors ER of Rt UE behind the head, heat, stretching, e-stim   Effect of Pain on Daily Activities not able to work          [facility] PT Assessment - 02/17/15 0001    Observation/Other Assessments   Focus on Therapeutic Outcomes (FOTO)  44%  limitation   ROM / Strength   AROM / PROM / Strength Strength;AROM   AROM   Overall AROM  Within functional limits for tasks performed   Overall AROM Comments Cervical AROM is full with Rt UT and neck pain with motions to the Lt.     Strength   Overall Strength Comments Rt shoulder 4+/5, Lt 5/5 with gross testing   Strength Assessment Site Hand;Shoulder   Right/Left hand Right;Left   Grip (lbs) 22#   Grip (lbs) 65#                  [facility] Adult PT Treatment/Exercise - 02/17/15 0001    Electrical Stimulation   Electrical Stimulation Location Rt neck and scapula   Electrical Stimulation Action interferential   Electrical Stimulation Parameters 15 min   Electrical Stimulation Goals Pain   Traction   Type of Traction Cervical   Min (lbs) 5   Max (lbs) 18   Hold Time 60   Rest Time 10   Time 15                PT Education - 02/17/15 1135    Education provided Yes   Education Details Pt educated pt how to use her home TENs unit and discussed home cervical traction units (over the  door)   Person(s) Educated Patient   Methods Explanation;Demonstration;Verbal cues   Comprehension Verbalized understanding             PT Long Term Goals - 02/17/15 1201    PT LONG TERM GOAL #1   Title demonstrate understanding of good posture   Status Achieved   PT LONG TERM GOAL #2   Title be independent in an advanced HEP   Status Achieved   PT LONG TERM GOAL #3   Title report a 30% reduction in Rt UE pain to allow for computer use   Status On-going   PT LONG TERM GOAL #4   Title demonstrate Rt grip strength to > or = to 45#   Time 8   Period Weeks   Status New               Plan - 02/17/15 1141    Clinical Impression Statement Pt with no change in symmptoms since the start of care.  Note sent to MD as she will see MD in 3 weeks.  Continued Rt UE pain and progressive weakness especially with grip.  Pt will pursue a home traction unit and use TENs for pain  management   Pt will benefit from skilled therapeutic intervention in order to improve on the following deficits Pain;Impaired UE functional use;Decreased activity tolerance   Rehab Potential Good   Clinical Impairments Affecting Rehab Potential Pt with limited attendance due to financial concerns.     PT Frequency 1x / week   PT Duration 8 weeks   PT Treatment/Interventions Moist Heat;Therapeutic activities;Therapeutic exercise;Traction;Ultrasound;Manual techniques;Neuromuscular re-education;Electrical Stimulation   PT Next Visit Plan Pt will see MD in 3 weeks and then decide if she will return to PT after this visit.  Rt UE strength exercises and traction if still helpful.   Consulted and Agree with Plan of Care Patient        Problem List Patient Active Problem List   Diagnosis Date Noted  . DDD (degenerative disc disease), cervical 07/08/2014  . Pain in joint, shoulder region 07/03/2014  . Obesity (BMI 35.0-39.9 without comorbidity) 07/03/2014    Golden Gilreath, PT 02/17/2015, 12:04 PM PHYSICAL THERAPY DISCHARGE SUMMARY  Visits from Start of Care: 4  Current functional level related to goals / functional outcomes: See goals for assessment.  Pt was not making significant progress and wanted to see MD this month to determine return to PT.  Pt didn't return.     Remaining deficits: Neck pain, UE radiculopathy and inability to work due to pain.    Education / Equipment: HEP, home TENs instruction, body mechanics and posture. Plan: Patient agrees to discharge.  Patient goals were not met. Patient is being discharged due to lack of progress.  ?????   Sigurd Sos, PT 03/10/2015 11:18 AM    [facility] [address] [place], [place], [address] Phone: [phone]   Fax:  [phone]

## 2015-03-02 ENCOUNTER — Other Ambulatory Visit: Payer: Self-pay | Admitting: Family Medicine

## 2015-03-02 DIAGNOSIS — Z1231 Encounter for screening mammogram for malignant neoplasm of breast: Secondary | ICD-10-CM

## 2015-03-11 ENCOUNTER — Ambulatory Visit: Payer: Managed Care, Other (non HMO)

## 2015-04-07 ENCOUNTER — Ambulatory Visit: Payer: Medicaid Other

## 2015-04-20 ENCOUNTER — Ambulatory Visit: Payer: Medicaid Other | Attending: Physical Medicine and Rehabilitation

## 2015-04-20 DIAGNOSIS — R6889 Other general symptoms and signs: Secondary | ICD-10-CM | POA: Insufficient documentation

## 2015-04-20 DIAGNOSIS — M542 Cervicalgia: Secondary | ICD-10-CM | POA: Diagnosis present

## 2015-04-20 DIAGNOSIS — M5410 Radiculopathy, site unspecified: Secondary | ICD-10-CM | POA: Diagnosis not present

## 2015-04-20 DIAGNOSIS — R29898 Other symptoms and signs involving the musculoskeletal system: Secondary | ICD-10-CM | POA: Diagnosis not present

## 2015-04-20 NOTE — Addendum Note (Signed)
Addended by: Edrick OhAKACS, KELLY M on: 04/20/2015 11:59 AM   Modules accepted: Orders

## 2015-04-20 NOTE — Therapy (Addendum)
[facility] [address] [place], [place], [address] Phone: [phone]   Fax:  [phone]  Physical Therapy Evaluation  Patient Details  Name: Chelsea Winters MRN: [id number] Date of Birth: 1966-03-26 Referring Provider:  Claria Dice, MD  Encounter Date: 04/20/2015      PT End of Session - 04/20/15 1133    Visit Number 1   Date for PT Re-Evaluation 06/15/15   PT Start Time 1104   PT Stop Time 1151   PT Time Calculation (min) 47 min   Activity Tolerance Patient tolerated treatment well   Behavior During Therapy [facility] for tasks assessed/performed      History reviewed. No pertinent past medical history.  History reviewed. No pertinent past surgical history.  There were no vitals filed for this visit.  Visit Diagnosis:  Neck pain, bilateral - Plan: PT plan of care cert/re-cert, CANCELED: PT plan of care cert/re-cert  Radiculopathy of arm - Plan: PT plan of care cert/re-cert, CANCELED: PT plan of care cert/re-cert  Activity intolerance - Plan: PT plan of care cert/re-cert, CANCELED: PT plan of care cert/re-cert  Right arm weakness - Plan: PT plan of care cert/re-cert, CANCELED: PT plan of care cert/re-cert      Subjective Assessment - 04/20/15 1107    Subjective Pt is a Rt hand dominant female who presents with continued Rt UE radiculopathy that began 06/2014.  No incident or injury.     Limitations Sitting   How long can you sit comfortably? 10-15 minutes max expecially with bending the Rt elbow.     Diagnostic tests MRI: complete August 07/2014.  Pt reports C5/6 and C6/7 disc herniations.     Patient Stated Goals Reduce UE pain so that she can work   Currently in Pain? Yes   Pain Score 7    Pain Location Arm   Pain Orientation Right   Pain Descriptors / Indicators Burning;Aching;Numbness   Pain Type Neuropathic pain   Pain Radiating Towards Rt elbow and fingers (digits 4 & 5)   Pain Onset More than a month  ago   Pain Frequency Constant   Aggravating Factors  Using Rt UE with Rt UE bent.     Pain Relieving Factors stretching, ER of Rt UE behind the head.     Effect of Pain on Daily Activities not able to work   Multiple Pain Sites No            [facility] PT Assessment - 04/20/15 0001    Assessment   Medical Diagnosis neck pain   Onset Date 07/02/15   Precautions   Precautions None   Balance Screen   Has the patient fallen in the past 6 months No   Has the patient had a decrease in activity level because of a fear of falling?  No   Is the patient reluctant to leave their home because of a fear of falling?  No   Home Environment   Living Enviornment Private residence   Prior Function   Level of Independence Independent with basic ADLs   Vocation Unemployed  pt lost her job because she couldnt work due to pain   Observation/Other Assessments   Focus on Therapeutic Outcomes (FOTO)  54% limitation   ROM / Strength   AROM / PROM / Strength AROM;Strength   AROM   Overall AROM  Within functional limits for tasks performed   Overall AROM Comments Cervical AROM is full without pain   Strength  Overall Strength Comments Rt shoulder 4+/5, Lt 5/5 with gross testing   Strength Assessment Site Hand   Right/Left hand Right;Left   Grip (lbs) 18#   Grip (lbs) 58#   Palpation   Palpation palpable tension over Rt>Lt Upper Trap.                     [facility] Adult PT Treatment/Exercise - 04/20/15 0001    Traction   Type of Traction Cervical   Min (lbs) 5   Max (lbs) 16   Hold Time 60   Rest Time 10   Time 15                  PT Short Term Goals - 04/20/15 1137    PT SHORT TERM GOAL #1   Title report a 10% reduction in Rt UE radiulopathy   Time 4   Period Weeks   Status New   PT SHORT TERM GOAL #2   Title demonstrate 25# Rt grip strength to improve use of Rt UE    Time 4   Period Weeks   Status New           PT Long Term Goals - 04/20/15 1137    PT LONG  TERM GOAL #1   Title be independent in advanced HEP   Time 8   Period Weeks   Status New   PT LONG TERM GOAL #2   Title report a 25% reduction in Rt UE radiculopathy with Rt UE use   Time 8   Period Weeks   Status New   PT LONG TERM GOAL #3   Title demonstrate 30# Rt grip strength to improve use of Rt UE   Time 8   Period Weeks   Status New   PT LONG TERM GOAL #4   Title reduce FOTO to < or = to 41% limitation   Time 8   Period Weeks   Status New               Plan - 04/20/15 1134    Clinical Impression Statement Pt reports to PT with ongoing Rt UE radiculopathy lasting 10 months due to disc herniation per MRI results.  Pt with Rt UE weakness and limited function/use.  Pt is not able to work.  Pt will benefit from cervical traction, decompression exercise and UE strength.     Pt will benefit from skilled therapeutic intervention in order to improve on the following deficits Pain;Impaired UE functional use;Decreased activity tolerance;Decreased strength   PT Frequency 2x / week   PT Duration 8 weeks   PT Treatment/Interventions Moist Heat;Therapeutic activities;Therapeutic exercise;Traction;Ultrasound;Manual techniques;Neuromuscular re-education;Electrical Stimulation;Patient/family education   PT Next Visit Plan Cervical traction, manual, Rt UE and grip strength, postural strength   Consulted and Agree with Plan of Care Patient         Problem List Patient Active Problem List   Diagnosis Date Noted  . DDD (degenerative disc disease), cervical 07/08/2014  . Pain in joint, shoulder region 07/03/2014  . Obesity (BMI 35.0-39.9 without comorbidity) 07/03/2014    Brendalyn Vallely, PT 04/20/2015, 3:48 PM  [facility] [address], [address] [place], [place], [address] Phone: [phone]   Fax:  [phone]

## 2015-04-20 NOTE — Addendum Note (Signed)
Addended by: Nils FlackSMITH, Tejon Gracie C on: 04/20/2015 12:12 PM   Modules accepted: Orders

## 2015-04-29 ENCOUNTER — Ambulatory Visit: Payer: Medicaid Other | Admitting: Physical Therapy

## 2015-04-29 ENCOUNTER — Encounter: Payer: Self-pay | Admitting: Physical Therapy

## 2015-04-29 DIAGNOSIS — M542 Cervicalgia: Secondary | ICD-10-CM

## 2015-04-29 DIAGNOSIS — R29898 Other symptoms and signs involving the musculoskeletal system: Secondary | ICD-10-CM

## 2015-04-29 DIAGNOSIS — M5410 Radiculopathy, site unspecified: Secondary | ICD-10-CM

## 2015-04-29 DIAGNOSIS — R6889 Other general symptoms and signs: Secondary | ICD-10-CM

## 2015-04-29 NOTE — Therapy (Signed)
[facility] [address], [address] [place], [place], [address] Phone: [phone]   Fax:  [phone]  Physical Therapy Treatment  Patient Details  Name: Chelsea Winters MRN: [id number] Date of Birth: 27-Apr-1966 Referring Provider:  Claria DiceWang, Hao, MD  Encounter Date: 04/29/2015      PT End of Session - 04/29/15 1142    Visit Number 2   Date for PT Re-Evaluation 06/15/15   PT Start Time 1017   PT Stop Time 1106   PT Time Calculation (min) 49 min   Activity Tolerance Patient tolerated treatment well   Behavior During Therapy [facility] for tasks assessed/performed      History reviewed. No pertinent past medical history.  History reviewed. No pertinent past surgical history.  There were no vitals filed for this visit.  Visit Diagnosis:  Neck pain, bilateral  Radiculopathy of arm  Activity intolerance  Right arm weakness      Subjective Assessment - 04/29/15 1036    Subjective Pt is Rt hand dominant but has only 33# of grip strength, due to radiating pain in Rt UE, MRI  shows disc herniation C5/6 and C6/7   Limitations Sitting;Other (comment)  reaching with elbow bend   Currently in Pain? Yes   Pain Score 4   premedicated, sees pain specialist, turning head to right pain up to 6/10   Pain Location Arm   Pain Orientation Right   Pain Descriptors / Indicators Burning;Aching;Numbness;Tingling   Pain Type Neuropathic pain   Pain Radiating Towards Rt elbow and fingers (digits 4 & 5)   Pain Onset More than a month ago   Pain Frequency Constant   Aggravating Factors  bending Rt elbow and reaching   Pain Relieving Factors pain meds, ER of Rt UR behing the head   Effect of Pain on Daily Activities not able to work   Multiple Pain Sites No                         [facility] Adult PT Treatment/Exercise - 04/29/15 0001    Exercises   Exercises Neck   Neck Exercises: Machines for Strengthening   Other Machines  for Strengthening Digiflex red x 2 min    Other Machines for Strengthening Handynamometer 33# today   Neck Exercises: Seated   Neck Retraction 20 reps;10 reps;3 secs   Cervical Rotation Left;10 reps   Lateral Flexion 20 reps  neck retraction into lat flexion 10sec hold    Other Seated Exercise Pulleys flexion 3 min, while performing 2 x10 cervical rotation    Neck Exercises: Supine   Other Supine Exercise thoracic self mobilisation with towel roll   Traction   Type of Traction Cervical   Min (lbs) 5   Max (lbs) 16   Hold Time 60   Rest Time 10   Time 15                  PT Short Term Goals - 04/29/15 1145    PT SHORT TERM GOAL #1   Title report a 10% reduction in Rt UE radiulopathy   Time 4   Period Weeks   Status On-going   PT SHORT TERM GOAL #2   Title demonstrate 25# Rt grip strength to improve use of Rt UE   As of 04/29/15 33# Rt grip strength, test next time     Time 4   Period Weeks   Status On-going  PT Long Term Goals - 04/29/15 1147    PT LONG TERM GOAL #1   Title be independent in advanced HEP   Time 8   Period Weeks   Status On-going   PT LONG TERM GOAL #2   Title report a 25% reduction in Rt UE radiculopathy with Rt UE use   Time 8   Period Weeks   Status On-going   PT LONG TERM GOAL #3   Title demonstrate 30# Rt grip strength to improve use of Rt UE   Time 8   Period Weeks   Status On-going   PT LONG TERM GOAL #4   Title reduce FOTO to < or = to 41% limitation   Time 8   Period Weeks   Status On-going               Plan - 04/29/15 1143    Clinical Impression Statement Pt with Rt UE weakness and limited function/use. Pt with cervical protraction and limited thoracic mobilisation    Pt will benefit from skilled therapeutic intervention in order to improve on the following deficits Pain;Impaired UE functional use;Decreased activity tolerance;Decreased strength   Rehab Potential Good   PT Frequency 2x / week   PT  Duration 8 weeks   PT Treatment/Interventions Moist Heat;Therapeutic activities;Therapeutic exercise;Traction;Ultrasound;Manual techniques;Neuromuscular re-education;Electrical Stimulation;Patient/family education   PT Next Visit Plan Cervical traction, manual, Rt UE and grip strength, postural strength, thoracic mobilisation   Consulted and Agree with Plan of Care Patient        Problem List Patient Active Problem List   Diagnosis Date Noted  . DDD (degenerative disc disease), cervical 07/08/2014  . Pain in joint, shoulder region 07/03/2014  . Obesity (BMI 35.0-39.9 without comorbidity) 07/03/2014    NAUMANN-HOUEGNIFIO,Hayward Rylander 04/29/2015, 11:55 AM  [facility] [address], [address] [place], [place], [address] Phone: [phone]   Fax:  [phone]

## 2015-05-05 ENCOUNTER — Encounter: Payer: Self-pay | Admitting: Physical Therapy

## 2015-05-05 ENCOUNTER — Ambulatory Visit: Payer: Medicaid Other | Admitting: Physical Therapy

## 2015-05-05 DIAGNOSIS — M5410 Radiculopathy, site unspecified: Secondary | ICD-10-CM

## 2015-05-05 DIAGNOSIS — R6889 Other general symptoms and signs: Secondary | ICD-10-CM

## 2015-05-05 DIAGNOSIS — M542 Cervicalgia: Secondary | ICD-10-CM

## 2015-05-05 DIAGNOSIS — R29898 Other symptoms and signs involving the musculoskeletal system: Secondary | ICD-10-CM

## 2015-05-05 NOTE — Therapy (Signed)
[facility] [address], [address] [place], [place], [address] Phone: [phone]   Fax:  [phone]  Physical Therapy Treatment  Patient Details  Name: Chelsea Winters MRN: [id number] Date of Birth: Apr 25, 1966 Referring Provider:  Claria DiceWang, Hao, MD  Encounter Date: 05/05/2015      PT End of Session - 05/05/15 1107    Visit Number 3   Date for PT Re-Evaluation 06/15/15   PT Start Time 1019   PT Stop Time 1115   PT Time Calculation (min) 56 min   Activity Tolerance Patient tolerated treatment well   Behavior During Therapy [facility] for tasks assessed/performed      History reviewed. No pertinent past medical history.  History reviewed. No pertinent past surgical history.  There were no vitals filed for this visit.  Visit Diagnosis:  Neck pain, bilateral  Radiculopathy of arm  Activity intolerance  Right arm weakness      Subjective Assessment - 05/05/15 1020    Limitations Other (comment);Sitting   Currently in Pain? Yes   Pain Score 6   pt vacumed on Saturday what may triggered the pain    Pain Location Neck  pain in UE Rt . Lt with elbow flexion   Pain Orientation Right   Multiple Pain Sites No                         [facility] Adult PT Treatment/Exercise - 05/05/15 0001    Exercises   Exercises Shoulder   Neck Exercises: Machines for Strengthening   Other Machines for Strengthening Digiflex red x 2 min x 2   Other Machines for Strengthening Handynamometer 44#, 28# and 43 today   Neck Exercises: Theraband   Other Theraband Exercises scapular PNF with yellow t-band, retraction abd, D2   Neck Exercises: Seated   Neck Retraction 20 reps;10 reps;3 secs   Cervical Rotation Left;20 reps;10 reps   Lateral Flexion Left;5 reps;Other (comment)  very limited ROM   Other Seated Exercise Pulleys flexion 3 min, while performing 2 x10 cervical rotation    Shoulder Exercises: Supine   Other Supine  Exercises Retraction, and abd  with yellow t-band 2 x 10 each   Traction   Type of Traction Cervical   Min (lbs) 5   Max (lbs) 17   Hold Time 60   Rest Time 10   Time 15                PT Education - 05/05/15 1107    Education provided Yes   Education Details PNF strengthening   Methods Demonstration;Explanation;Handout   Comprehension Verbalized understanding;Returned demonstration          PT Short Term Goals - 05/05/15 1113    PT SHORT TERM GOAL #1   Title report a 10% reduction in Rt UE radiulopathy   Time 4   Period Weeks   Status On-going   PT SHORT TERM GOAL #2   Title demonstrate 25# Rt grip strength to improve use of Rt UE    Time 4   Period Weeks   Status On-going           PT Long Term Goals - 05/05/15 1113    PT LONG TERM GOAL #1   Title be independent in advanced HEP   Time 8   Period Weeks   Status On-going   PT LONG TERM GOAL #2   Title report a 25% reduction in Rt UE radiculopathy  with Rt UE use   Time 8   Period Weeks   Status On-going   PT LONG TERM GOAL #3   Title demonstrate 30# Rt grip strength to improve use of Rt UE   Time 8   Period Weeks   Status On-going   PT LONG TERM GOAL #4   Title reduce FOTO to < or = to 41% limitation   Time 8   Period Weeks   Status On-going               Plan - 05/05/15 1108    Clinical Impression Statement Pt with Rt UE wekaness and limited function. Pt with guarding posture    Pt will benefit from skilled therapeutic intervention in order to improve on the following deficits Pain;Impaired UE functional use;Decreased activity tolerance;Decreased strength   Rehab Potential Good   PT Frequency 2x / week   PT Duration 8 weeks   PT Treatment/Interventions Moist Heat;Therapeutic activities;Therapeutic exercise;Traction;Ultrasound;Manual techniques;Neuromuscular re-education;Electrical Stimulation;Patient/family education   PT Next Visit Plan Review shoulder strength with yellow t-band,  cervical traction, thoracic flexibility   Consulted and Agree with Plan of Care Patient        Problem List Patient Active Problem List   Diagnosis Date Noted  . DDD (degenerative disc disease), cervical 07/08/2014  . Pain in joint, shoulder region 07/03/2014  . Obesity (BMI 35.0-39.9 without comorbidity) 07/03/2014    NAUMANN-HOUEGNIFIO,Jibreel Fedewa PTA 05/05/2015, 11:18 AM  [facility] [address], [address] [place], [place], [address] Phone: [phone]   Fax:  [phone]

## 2015-05-05 NOTE — Patient Instructions (Signed)
  PNF Strengthening: Resisted   Supine, sitting or standing with resistive band around each hand, bring right arm up and away, thumb back. Repeat _10___ times per set. Do _2___ sets per session. Do _1-2___ sessions per day.      Resisted Horizontal Abduction: Bilateral   Supine, Sit or stand, t-band in both hands, pinch shoulder blades together and move hands to outside.  Repeat _10___ times per set. Do 2____ sets per session. Do _1-2___ sessions per day.                  Scapular Retraction: Elbow Flexion (Standing)   With elbows bent to 90, pinch shoulder blades together and rotate arms out, keeping elbows bent. Repeat _10___ times per set. Do _2___ sets per session. Do many____ sessions per day.    Strengthening: Resisted Extension   Hold tubing in right hand, arm forward. Pull arm back, elbow straight. Repeat _10___ times per set. Do _2___ sets per session. Do _1-2___ sessions per day.  Can place band around the front of your body and perform with both arms at the same time.

## 2015-05-11 ENCOUNTER — Encounter: Payer: Self-pay | Admitting: Physical Therapy

## 2015-05-11 ENCOUNTER — Ambulatory Visit: Payer: Medicaid Other | Admitting: Physical Therapy

## 2015-05-11 DIAGNOSIS — R6889 Other general symptoms and signs: Secondary | ICD-10-CM

## 2015-05-11 DIAGNOSIS — M5410 Radiculopathy, site unspecified: Secondary | ICD-10-CM

## 2015-05-11 DIAGNOSIS — M542 Cervicalgia: Secondary | ICD-10-CM

## 2015-05-11 DIAGNOSIS — R29898 Other symptoms and signs involving the musculoskeletal system: Secondary | ICD-10-CM

## 2015-05-11 NOTE — Therapy (Signed)
[facility] [address], [address] [place], [place], [address] Phone: [phone]   Fax:  [phone]  Physical Therapy Treatment  Patient Details  Name: Chelsea Winters MRN: [id number] Date of Birth: May 10, 1966 Referring Provider:  Claria DiceWang, Hao, MD  Encounter Date: 05/11/2015      PT End of Session - 05/11/15 1627    Visit Number 4   Date for PT Re-Evaluation 06/15/15   PT Start Time 1018   PT Stop Time 1110   PT Time Calculation (min) 52 min      History reviewed. No pertinent past medical history.  History reviewed. No pertinent past surgical history.  There were no vitals filed for this visit.  Visit Diagnosis:  Neck pain, bilateral  Radiculopathy of arm  Activity intolerance  Right arm weakness      Subjective Assessment - 05/11/15 1022    Subjective Pt reports she feels improvement, but has not vacuumed to avoid incr of pain in neck and radiating pain in Rt UE   Limitations Other (comment);Sitting   Currently in Pain? Yes  with elbow flexion incr of radiating pain in Rt UE   Pain Score 3    Pain Location Neck   Pain Orientation Right   Pain Type Neuropathic pain   Pain Onset More than a month ago   Pain Frequency Constant   Multiple Pain Sites No                         [facility] Adult PT Treatment/Exercise - 05/11/15 0001    Exercises   Exercises Shoulder   Neck Exercises: Machines for Strengthening   Other Machines for Strengthening Digiflex green x 2 min    Other Machines for Strengthening Handynamometer 30#, 31# and 38# today   Neck Exercises: Theraband   Other Theraband Exercises scapular PNF with yellow t-band, retraction abd, D2  in supine   Neck Exercises: Seated   Neck Retraction 20 reps;10 reps;3 secs   Cervical Rotation Left;5 reps   Lateral Flexion Left;5 reps;Other (comment)   Other Seated Exercise Pulleys flexion 3 min, while performing 2 x10 cervical rotation    Neck Exercises: Supine   Other Supine Exercise thoracic self mobilisation with towel roll   Shoulder Exercises: Standing   Other Standing Exercises yellow t-band into    Other Standing Exercises D2, scapular retraction leaning against doorframe   Traction   Type of Traction Cervical   Min (lbs) 5   Max (lbs) 17  initially had weight on 18#, but needed to decrease to 17#   Hold Time 60   Rest Time 10   Time 15                PT Education - 05/11/15 1058    Education provided Yes   Education Details Supine thoracic self mobilization with towel   Person(s) Educated Patient   Methods Explanation;Handout;Demonstration   Comprehension Verbalized understanding;Returned demonstration          PT Short Term Goals - 05/11/15 1715    PT SHORT TERM GOAL #1   Title report a 10% reduction in Rt UE radiulopathy   Time 4   Period Weeks   Status On-going   PT SHORT TERM GOAL #2   Title demonstrate 25# Rt grip strength to improve use of Rt UE    Time 4   Period Weeks   Status On-going  PT Long Term Goals - 05/11/15 1715    PT LONG TERM GOAL #1   Title be independent in advanced HEP   Time 8   Period Weeks   Status On-going   PT LONG TERM GOAL #2   Title report a 25% reduction in Rt UE radiculopathy with Rt UE use   Time 8   Period Weeks   Status On-going   PT LONG TERM GOAL #3   Title demonstrate 30# Rt grip strength to improve use of Rt UE   Time 8   Period Weeks   Status On-going   PT LONG TERM GOAL #4   Title reduce FOTO to < or = to 41% limitation   Time 8   Period Weeks   Status On-going               Plan - 05/11/15 1718    Clinical Impression Statement Pt with Rt LE weakness and limited function especially with activities envolving flexion of elbow Rt > Lt   Pt will benefit from skilled therapeutic intervention in order to improve on the following deficits Pain;Impaired UE functional use;Decreased activity tolerance;Decreased strength    Rehab Potential Good   PT Frequency 2x / week   PT Duration 8 weeks   PT Treatment/Interventions Moist Heat;Therapeutic activities;Therapeutic exercise;Traction;Ultrasound;Manual techniques;Neuromuscular re-education;Electrical Stimulation;Patient/family education   PT Next Visit Plan Review shoulder strength with yellow t-band, cervical traction, thoracic flexibility   Consulted and Agree with Plan of Care Patient        Problem List Patient Active Problem List   Diagnosis Date Noted  . DDD (degenerative disc disease), cervical 07/08/2014  . Pain in joint, shoulder region 07/03/2014  . Obesity (BMI 35.0-39.9 without comorbidity) 07/03/2014    NAUMANN-HOUEGNIFIO,Angel Weedon PTA 05/11/2015, 5:23 PM  [facility] [address] [place], [place], [address] Phone: [phone]   Fax:  [phone]

## 2015-05-11 NOTE — Patient Instructions (Signed)
Thoracic Self-Mobilization (Sitting)   With small rolled towel at lower ribs level, gently lean back until stretch is felt. Hold  20 seconds. Relax. Repeat 3 times per set. Do 2-3____ sets per session. Do _2-3___ sessions per day.  http://orth.exer.us/998   Copyright  VHI. All rights reserved.  Thoracic Self-Mobilization Stretch (Supine)   With small rolled towel at lower ribs level (Bra area), gently lie back until stretch is felt Have small towel under your head for support and have your arms relaxes at side.  Stay there for 2 min  Up to 3 min. Repeat  1  times per set.  Do 1  sessions per day.  http://orth.exer.us/994   Copyright  VHI. All rights reserved.

## 2015-05-15 DIAGNOSIS — Z0271 Encounter for disability determination: Secondary | ICD-10-CM

## 2015-05-20 ENCOUNTER — Ambulatory Visit: Payer: Medicaid Other | Attending: Physical Medicine and Rehabilitation

## 2015-05-20 DIAGNOSIS — M5410 Radiculopathy, site unspecified: Secondary | ICD-10-CM | POA: Diagnosis present

## 2015-05-20 DIAGNOSIS — R6889 Other general symptoms and signs: Secondary | ICD-10-CM

## 2015-05-20 DIAGNOSIS — R29898 Other symptoms and signs involving the musculoskeletal system: Secondary | ICD-10-CM | POA: Diagnosis present

## 2015-05-20 DIAGNOSIS — M542 Cervicalgia: Secondary | ICD-10-CM | POA: Diagnosis present

## 2015-05-20 NOTE — Therapy (Signed)
[facility] [address], [address] [place], [place], [address] Phone: [phone]   Fax:  [phone]  Physical Therapy Treatment  Patient Details  Name: Orlando PennerBernadette Kohn MRN: [id number] Date of Birth: 12-30-1965 Referring Provider:  Claria DiceWang, Hao, MD  Encounter Date: 05/20/2015      PT End of Session - 05/20/15 1131    Visit Number 5   Date for PT Re-Evaluation 06/15/15   PT Start Time 1058   PT Stop Time 1147   PT Time Calculation (min) 49 min   Activity Tolerance Patient tolerated treatment well   Behavior During Therapy [facility] for tasks assessed/performed      History reviewed. No pertinent past medical history.  History reviewed. No pertinent past surgical history.  There were no vitals filed for this visit.  Visit Diagnosis:  Neck pain, bilateral  Radiculopathy of arm  Activity intolerance  Right arm weakness      Subjective Assessment - 05/20/15 1059    Subjective Feeling better now.  Neck and upper back feel better. Arm symptoms with little change.     Currently in Pain? Yes   Pain Score 4    Pain Location Neck  upper back   Pain Orientation Right   Pain Descriptors / Indicators Sore   Pain Radiating Towards Rt elbow and fingers (digits 4& 5), burning in elbow   Pain Onset More than a month ago   Pain Frequency Constant   Aggravating Factors  bending the Rt elbow (arm pain), nothing aggravates the neck   Pain Relieving Factors pain meds, ER of Rt UE behind the head   Multiple Pain Sites No            [facility] PT Assessment - 05/20/15 0001    Assessment   Medical Diagnosis neck pain   Onset Date/Surgical Date 07/01/14   Strength   Right/Left hand Right   Right Hand Grip (lbs) 40  49#                     [facility] Adult PT Treatment/Exercise - 05/20/15 0001    Neck Exercises: Machines for Strengthening   Other Machines for Strengthening Digiflex green x 3 min    Shoulder Exercises:  Seated   Other Seated Exercises 3 way raises: 1# added 2x10 with verbal cues for posture   Shoulder Exercises: Standing   Extension Strengthening;Both;20 reps   Modalities   Modalities Ultrasound   Ultrasound   Ultrasound Location Rt neck/UT   Ultrasound Parameters 1.2 w/cm2 cont x8 minutes   Ultrasound Goals Pain   Traction   Type of Traction Cervical   Min (lbs) 5   Max (lbs) 17   Hold Time 60   Rest Time 10   Time 15                  PT Short Term Goals - 05/20/15 1106    PT SHORT TERM GOAL #1   Title report a 10% reduction in Rt UE radiulopathy   Status On-going  Pt denies any change in this   PT SHORT TERM GOAL #2   Title demonstrate 25# Rt grip strength to improve use of Rt UE    Status Achieved  49#           PT Long Term Goals - 05/20/15 1106    PT LONG TERM GOAL #1   Title be independent in advanced HEP   Time 8   Period Weeks  Status On-going  Pt is independent in current HEP   PT LONG TERM GOAL #3   Title demonstrate 30# Rt grip strength to improve use of Rt UE   Status Achieved  49#               Plan - 05/20/15 1113    Clinical Impression Statement Pt with improved Rt grip strength to day and tolerated all exercises well today in the clinic except Rt UE pain wiht abduction.  Pt with continued Rt UE radiculopathy that limits use of Rt UE. Pt will benefit from PT for UE strength and traction for UE radiculopathy.   Pt will benefit from skilled therapeutic intervention in order to improve on the following deficits Pain;Impaired UE functional use;Decreased activity tolerance;Decreased strength   Rehab Potential Good   PT Frequency 2x / week   PT Duration 8 weeks   PT Treatment/Interventions Moist Heat;Therapeutic activities;Therapeutic exercise;Traction;Ultrasound;Manual techniques;Neuromuscular re-education;Electrical Stimulation;Patient/family education   PT Next Visit Plan Rt UE strength, neck flexibility, traction.  Give pt FOTO  to determine status of LTG.   Consulted and Agree with Plan of Care Patient        Problem List Patient Active Problem List   Diagnosis Date Noted  . DDD (degenerative disc disease), cervical 07/08/2014  . Pain in joint, shoulder region 07/03/2014  . Obesity (BMI 35.0-39.9 without comorbidity) 07/03/2014    Shemaiah Round, PT 05/20/2015, 11:36 AM  [facility] [address] [place], [place], [address] Phone: [phone]   Fax:  [phone]

## 2015-05-25 ENCOUNTER — Encounter: Payer: Self-pay | Admitting: Physical Therapy

## 2015-05-25 ENCOUNTER — Ambulatory Visit: Payer: Medicaid Other | Admitting: Physical Therapy

## 2015-05-25 DIAGNOSIS — M542 Cervicalgia: Secondary | ICD-10-CM | POA: Diagnosis not present

## 2015-05-25 DIAGNOSIS — R6889 Other general symptoms and signs: Secondary | ICD-10-CM

## 2015-05-25 DIAGNOSIS — R29898 Other symptoms and signs involving the musculoskeletal system: Secondary | ICD-10-CM

## 2015-05-25 DIAGNOSIS — M5410 Radiculopathy, site unspecified: Secondary | ICD-10-CM

## 2015-05-25 NOTE — Therapy (Signed)
[facility] [address], [address] [place], [place], [id number] Phone: [phone]   Fax:  [phone]  Physical Therapy Treatment  Patient Details  Name: Chelsea PennerBernadette Kimery MRN: [id number] Date of Birth: 1966/03/29 Referring Provider:  Claria DiceWang, Hao, MD  Encounter Date: 05/25/2015      PT End of Session - 05/25/15 1129    Visit Number 6   Date for PT Re-Evaluation 06/15/15   PT Start Time 1101   PT Stop Time 1150   PT Time Calculation (min) 49 min   Activity Tolerance Patient tolerated treatment well   Behavior During Therapy [facility] for tasks assessed/performed      History reviewed. No pertinent past medical history.  History reviewed. No pertinent past surgical history.  There were no vitals filed for this visit.  Visit Diagnosis:  Neck pain, bilateral  Radiculopathy of arm  Activity intolerance  Right arm weakness      Subjective Assessment - 05/25/15 1102    Subjective Symptoms variable, day to day. Neck pain some better. Could not make fist yesterday, nerve symptoms in forearm and fingers today.   Currently in Pain? Yes   Pain Score 4    Pain Location Neck   Pain Orientation Right   Pain Descriptors / Indicators Numbness;Radiating;Tingling  Into post forearm   Aggravating Factors  Not sure   Pain Relieving Factors Meds,    Multiple Pain Sites No                         [facility] Adult PT Treatment/Exercise - 05/25/15 0001    Ultrasound   Ultrasound Location RT upper trap   Ultrasound Parameters 1wt/cm2 100%   Ultrasound Goals Pain   Traction   Type of Traction Cervical   Min (lbs) 5   Max (lbs) 16   Hold Time 60   Rest Time 10   Time 15                PT Education - 05/25/15 1131    Education provided Yes   Education Details Talked at length and answered pt's questions regarding her weakness and what happens the longer she stays weak/having nerve symptoms.   Person(s) Educated  Patient   Methods Explanation   Comprehension Verbalized understanding          PT Short Term Goals - 05/25/15 1134    PT SHORT TERM GOAL #1   Title report a 10% reduction in Rt UE radiulopathy   Time 4   Period Weeks   Status On-going  Still no consistent change/improvement.   PT SHORT TERM GOAL #2   Title demonstrate 25# Rt grip strength to improve use of Rt UE    Time 4   Status Achieved           PT Long Term Goals - 05/20/15 1106    PT LONG TERM GOAL #1   Title be independent in advanced HEP   Time 8   Period Weeks   Status On-going  Pt is independent in current HEP   PT LONG TERM GOAL #3   Title demonstrate 30# Rt grip strength to improve use of Rt UE   Status Achieved  49#               Plan - 05/25/15 1137    Clinical Impression Statement Patient had a lot of reported radicular sympoms today and described not being able to close her fist  at all yesterday. Today she could move her fingers better but not great.    Pt will benefit from skilled therapeutic intervention in order to improve on the following deficits Pain;Impaired UE functional use;Decreased activity tolerance;Decreased strength   Rehab Potential Good   PT Frequency 2x / week   PT Duration 8 weeks   PT Treatment/Interventions Moist Heat;Therapeutic activities;Therapeutic exercise;Traction;Ultrasound;Manual techniques;Neuromuscular re-education;Electrical Stimulation;Patient/family education   PT Next Visit Plan Traction, resume shoulder strength   Consulted and Agree with Plan of Care Patient        Problem List Patient Active Problem List   Diagnosis Date Noted  . DDD (degenerative disc disease), cervical 07/08/2014  . Pain in joint, shoulder region 07/03/2014  . Obesity (BMI 35.0-39.9 without comorbidity) 07/03/2014    Donyell Ding, PTA 05/25/2015, 11:44 AM  [facility] [address], [address] [place], [place],  [address] Phone: [phone]   Fax:  [phone]

## 2015-06-01 ENCOUNTER — Encounter: Payer: Self-pay | Admitting: Physical Therapy

## 2015-06-01 ENCOUNTER — Ambulatory Visit: Payer: Medicaid Other | Admitting: Physical Therapy

## 2015-06-01 DIAGNOSIS — M5410 Radiculopathy, site unspecified: Secondary | ICD-10-CM

## 2015-06-01 DIAGNOSIS — R29898 Other symptoms and signs involving the musculoskeletal system: Secondary | ICD-10-CM

## 2015-06-01 DIAGNOSIS — M542 Cervicalgia: Secondary | ICD-10-CM

## 2015-06-01 DIAGNOSIS — R6889 Other general symptoms and signs: Secondary | ICD-10-CM

## 2015-06-01 NOTE — Therapy (Signed)
[facility] [address] [place], [place], [address] Phone: [phone]   Fax:  [phone]  Physical Therapy Treatment  Patient Details  Name: Chelsea Winters MRN: [id number] Date of Birth: Apr 21, 1966 Referring Provider:  Claria Dice, MD  Encounter Date: 06/01/2015      PT End of Session - 06/01/15 1118    Visit Number 7   Date for PT Re-Evaluation 06/15/15   PT Start Time 1101   PT Stop Time 1145   PT Time Calculation (min) 44 min   Activity Tolerance Patient tolerated treatment well   Behavior During Therapy [facility] for tasks assessed/performed      History reviewed. No pertinent past medical history.  History reviewed. No pertinent past surgical history.  There were no vitals filed for this visit.  Visit Diagnosis:  Neck pain, bilateral  Radiculopathy of arm  Activity intolerance  Right arm weakness      Subjective Assessment - 06/01/15 1113    Subjective Neck pain 20% better, but bending elbow is no imporvement to be reported   How long can you sit comfortably? 10-15 minutes max expecially with bending the Rt elbow.     Diagnostic tests MRI: complete August 07/2014.  Pt reports C5/6 and C6/7 disc herniations.     Patient Stated Goals Reduce UE pain so that she can work   Currently in Pain? Yes   Pain Score 4    Pain Location Neck   Pain Orientation Right   Pain Descriptors / Indicators Numbness;Radiating;Tingling   Pain Type Neuropathic pain   Pain Onset More than a month ago   Pain Frequency Constant   Multiple Pain Sites No                         [facility] Adult PT Treatment/Exercise - 06/01/15 0001    Ultrasound   Ultrasound Location Rt upper trap   Ultrasound Parameters 1W/cm2, 100%, 1 MHz   Ultrasound Goals Pain   Traction   Type of Traction Cervical   Min (lbs) 5   Max (lbs) 16   Hold Time 60   Rest Time 10   Time 15   Manual Therapy   Manual Therapy Soft tissue  mobilization   Manual therapy comments bil cervical paraspinals, bil UT with PROM for elongation with focus on Rt side                  PT Short Term Goals - 06/01/15 1121    PT SHORT TERM GOAL #1   Title report a 10% reduction in Rt UE radiulopathy   Time 4   Period Weeks   Status On-going   PT SHORT TERM GOAL #2   Title demonstrate 25# Rt grip strength to improve use of Rt UE    Time 4   Period Weeks   Status Achieved           PT Long Term Goals - 06/01/15 1121    PT LONG TERM GOAL #1   Title be independent in advanced HEP   Time 8   Period Weeks   Status On-going   PT LONG TERM GOAL #2   Title report a 25% reduction in Rt UE radiculopathy with Rt UE use   Time 8   Period Weeks   Status On-going   PT LONG TERM GOAL #3   Title demonstrate 30# Rt grip strength to improve use of Rt UE  Time 8   Period Weeks   Status Achieved   PT LONG TERM GOAL #4   Title reduce FOTO to < or = to 41% limitation   Time 8   Period Weeks   Status On-going               Plan - 06/01/15 1119    Clinical Impression Statement Patient is able to close her fist, but activities envolving bending of elbow are still painful and limited   Pt will benefit from skilled therapeutic intervention in order to improve on the following deficits Pain;Impaired UE functional use;Decreased activity tolerance;Decreased strength   Rehab Potential Good   PT Frequency 2x / week   PT Duration 8 weeks   PT Treatment/Interventions Moist Heat;Therapeutic activities;Therapeutic exercise;Traction;Ultrasound;Manual techniques;Neuromuscular re-education;Electrical Stimulation;Patient/family education   PT Next Visit Plan Traction, [place], strength   Consulted and Agree with Plan of Care Patient        Problem List Patient Active Problem List   Diagnosis Date Noted  . DDD (degenerative disc disease), cervical 07/08/2014  . Pain in joint, shoulder region 07/03/2014  . Obesity (BMI 35.0-39.9  without comorbidity) 07/03/2014    NAUMANN-HOUEGNIFIO,Kindra Bickham PTA 06/01/2015, 11:53 AM  [facility] [address], [address] [place], [place], [address] Phone: [phone]   Fax:  [phone]

## 2015-06-10 ENCOUNTER — Ambulatory Visit: Payer: Medicaid Other

## 2015-06-10 DIAGNOSIS — M542 Cervicalgia: Secondary | ICD-10-CM | POA: Diagnosis not present

## 2015-06-10 DIAGNOSIS — R29898 Other symptoms and signs involving the musculoskeletal system: Secondary | ICD-10-CM

## 2015-06-10 DIAGNOSIS — R6889 Other general symptoms and signs: Secondary | ICD-10-CM

## 2015-06-10 DIAGNOSIS — M5410 Radiculopathy, site unspecified: Secondary | ICD-10-CM

## 2015-06-10 NOTE — Therapy (Signed)
[facility] [address] [place], [place], [address] Phone: [phone]   Fax:  [phone]  Physical Therapy Treatment  Patient Details  Name: Chelsea Winters MRN: [id number] Date of Birth: 1966-06-02 Referring Provider:  Claria Dice, MD  Encounter Date: 06/10/2015      PT End of Session - 06/10/15 1050    Visit Number 8   Date for PT Re-Evaluation 06/15/15   PT Start Time 1018   PT Stop Time 1104   PT Time Calculation (min) 46 min      History reviewed. No pertinent past medical history.  History reviewed. No pertinent past surgical history.  There were no vitals filed for this visit.  Visit Diagnosis:  Neck pain, bilateral  Radiculopathy of arm  Activity intolerance  Right arm weakness      Subjective Assessment - 06/10/15 1019    Subjective Neck pain 20% better.  No change in arm pain.    Currently in Pain? Yes   Pain Score 4    Pain Location Neck   Pain Orientation Right   Pain Descriptors / Indicators Numbness;Radiating;Tingling   Pain Type Neuropathic pain   Pain Radiating Towards Rt elbow and fingers (digits 4 & 5)-rated 8/10 and aggravated with bending elbow and use of arm   Pain Onset More than a month ago   Pain Frequency Constant   Aggravating Factors  always there   Pain Relieving Factors medication   Multiple Pain Sites Yes            [facility] PT Assessment - 06/10/15 0001    Observation/Other Assessments   Focus on Therapeutic Outcomes (FOTO)  50% limitation   Strength   Right/Left hand Right;Left   Right Hand Grip (lbs) 41   Right Hand Lateral Pinch 13 lbs   Left Hand Lateral Pinch 21 lbs                     [facility] Adult PT Treatment/Exercise - 06/10/15 0001    Exercises   Exercises Hand   Neck Exercises: Machines for Strengthening   Other Machines for Strengthening Digiflex green x 3 min    Shoulder Exercises: Pulleys   Flexion 3 minutes   ABduction 3  minutes  PT present to discuss progress toward goals   Ultrasound   Ultrasound Location Rt UT   Ultrasound Parameters 1.2 w/cm2 cont x 6 min   Ultrasound Goals Pain   Traction   Type of Traction Cervical   Min (lbs) 5   Max (lbs) 16   Hold Time 60   Rest Time 10   Time 15                  PT Short Term Goals - 06/01/15 1121    PT SHORT TERM GOAL #1   Title report a 10% reduction in Rt UE radiulopathy   Time 4   Period Weeks   Status On-going   PT SHORT TERM GOAL #2   Title demonstrate 25# Rt grip strength to improve use of Rt UE    Time 4   Period Weeks   Status Achieved           PT Long Term Goals - 06/10/15 1024    PT LONG TERM GOAL #1   Title be independent in advanced HEP   Time 8   Period Weeks   Status On-going   PT LONG TERM GOAL #2   Title report  a 25% reduction in Rt UE radiculopathy with Rt UE use   Time 8   Period Weeks   Status On-going  Pt denies any change in Rt UE pain with use   PT LONG TERM GOAL #3   Title demonstrate 30# Rt grip strength to improve use of Rt UE   PT LONG TERM GOAL #4   Title reduce FOTO to < or = to 41% limitation   Status On-going  50% limitation               Plan - 06/10/15 1048    Clinical Impression Statement Pt with continued Rt UE and grip strength deficits.  No change in Rt UE pain and 20% overall improvement in neck symptoms.  FOTO 50% today (54% at evaluation).  Pt with limited tolerance for sitting and using Rt UE while filling out survey on tablet today.  Pt will see MD in July.  Pt will attend 1 more session for modalities, traction and advancement of HEP as needed.     Pt will benefit from skilled therapeutic intervention in order to improve on the following deficits Pain;Impaired UE functional use;Decreased activity tolerance;Decreased strength   Rehab Potential Good   PT Frequency 2x / week   PT Duration 8 weeks   PT Treatment/Interventions Moist Heat;Therapeutic activities;Therapeutic  exercise;Traction;Ultrasound;Manual techniques;Neuromuscular re-education;Electrical Stimulation;Patient/family education   PT Next Visit Plan Traction, [place], strength.  Route note to MD.     Earlyne Iba and Agree with Plan of Care Patient        Problem List Patient Active Problem List   Diagnosis Date Noted  . DDD (degenerative disc disease), cervical 07/08/2014  . Pain in joint, shoulder region 07/03/2014  . Obesity (BMI 35.0-39.9 without comorbidity) 07/03/2014    Ravon Mcilhenny, PT 06/10/2015, 10:52 AM  [facility] [address] [place], [place], [address] Phone: [phone]   Fax:  [phone]

## 2015-06-15 ENCOUNTER — Ambulatory Visit: Payer: Medicaid Other

## 2015-06-15 DIAGNOSIS — R6889 Other general symptoms and signs: Secondary | ICD-10-CM

## 2015-06-15 DIAGNOSIS — M5410 Radiculopathy, site unspecified: Secondary | ICD-10-CM

## 2015-06-15 DIAGNOSIS — M542 Cervicalgia: Secondary | ICD-10-CM | POA: Diagnosis not present

## 2015-06-15 DIAGNOSIS — R29898 Other symptoms and signs involving the musculoskeletal system: Secondary | ICD-10-CM
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
## 2018-05-14 ENCOUNTER — Encounter (HOSPITAL_COMMUNITY): Payer: Self-pay

## 2018-05-14 ENCOUNTER — Emergency Department (HOSPITAL_COMMUNITY)
Admission: EM | Admit: 2018-05-14 | Discharge: 2018-05-14 | Disposition: A | Discharge status: Home / Self Care | Payer: Self-pay | Attending: Emergency Medicine | Admitting: Emergency Medicine

## 2018-05-14 ENCOUNTER — Emergency Department (HOSPITAL_COMMUNITY): Payer: Self-pay

## 2018-05-14 ENCOUNTER — Other Ambulatory Visit: Payer: Self-pay

## 2018-05-14 DIAGNOSIS — M542 Cervicalgia: Secondary | ICD-10-CM | POA: Insufficient documentation

## 2018-05-14 DIAGNOSIS — R51 Headache: Secondary | ICD-10-CM

## 2018-05-14 DIAGNOSIS — Z79899 Other long term (current) drug therapy: Secondary | ICD-10-CM | POA: Insufficient documentation

## 2018-05-14 DIAGNOSIS — Z87891 Personal history of nicotine dependence: Secondary | ICD-10-CM | POA: Insufficient documentation

## 2018-05-14 DIAGNOSIS — R519 Headache, unspecified: Secondary | ICD-10-CM

## 2018-05-14 DIAGNOSIS — M79601 Pain in right arm: Secondary | ICD-10-CM | POA: Insufficient documentation

## 2018-05-14 MED ORDER — CYCLOBENZAPRINE HCL 10 MG PO TABS
10.0000 mg | ORAL_TABLET | Freq: Once | ORAL | Status: AC
  Administered 2018-05-14: 10 mg via ORAL
  Filled 2018-05-14: qty 1

## 2018-05-14 MED ORDER — IBUPROFEN 200 MG PO TABS
600.0000 mg | ORAL_TABLET | Freq: Once | ORAL | Status: AC
  Administered 2018-05-14: 600 mg via ORAL
  Filled 2018-05-14: qty 3

## 2018-05-14 MED ORDER — ACETAMINOPHEN 500 MG PO TABS
1000.0000 mg | ORAL_TABLET | Freq: Once | ORAL | Status: AC
  Administered 2018-05-14: 1000 mg via ORAL
  Filled 2018-05-14: qty 2

## 2018-05-14 MED ORDER — CYCLOBENZAPRINE HCL 10 MG PO TABS
10.0000 mg | ORAL_TABLET | Freq: Two times a day (BID) | ORAL | 0 refills | Status: AC | PRN
Start: 2018-05-14 — End: ?

## 2018-05-14 NOTE — ED Triage Notes (Signed)
Pt c/o neck pain as well as RT arm pain and a headache that began in the ambulance.  Placed in c-collar in triage.

## 2018-05-14 NOTE — Discharge Instructions (Addendum)
The pain your experiencing is likely due to muscle strain, you may take flexeril in addition to your home pain medications as needed for pain management. Do not combine with any pain reliever other than tylenol. Flexeril can make you drowsy, do not take before driving and do not combine with alcohol. The muscle soreness should improve over the next week. Follow up with your family doctor in the next week for a recheck if you are still having symptoms. Return to ED if pain is worsening, you develop weakness or numbness of extremities, or new or concerning symptoms develop.
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
## 2018-05-18 ENCOUNTER — Encounter (HOSPITAL_COMMUNITY): Payer: Self-pay | Admitting: Emergency Medicine

## 2018-05-18 DIAGNOSIS — F0781 Postconcussional syndrome: Secondary | ICD-10-CM | POA: Diagnosis not present

## 2018-05-18 DIAGNOSIS — R51 Headache: Secondary | ICD-10-CM | POA: Diagnosis present

## 2018-05-18 DIAGNOSIS — Y92410 Unspecified street and highway as the place of occurrence of the external cause: Secondary | ICD-10-CM | POA: Insufficient documentation

## 2018-05-18 DIAGNOSIS — Z87891 Personal history of nicotine dependence: Secondary | ICD-10-CM | POA: Insufficient documentation

## 2018-05-18 DIAGNOSIS — S161XXD Strain of muscle, fascia and tendon at neck level, subsequent encounter: Secondary | ICD-10-CM | POA: Insufficient documentation

## 2018-05-18 DIAGNOSIS — Y939 Activity, unspecified: Secondary | ICD-10-CM | POA: Insufficient documentation

## 2018-05-18 DIAGNOSIS — Z79899 Other long term (current) drug therapy: Secondary | ICD-10-CM | POA: Diagnosis not present

## 2018-05-18 DIAGNOSIS — G44309 Post-traumatic headache, unspecified, not intractable: Secondary | ICD-10-CM | POA: Insufficient documentation

## 2018-05-18 LAB — I-STAT BETA HCG BLOOD, ED (MC, WL, AP ONLY)

## 2018-05-18 LAB — BASIC METABOLIC PANEL
Anion gap: 10 (ref 5–15)
BUN: 9 mg/dL (ref 6–20)
CALCIUM: 9.2 mg/dL (ref 8.9–10.3)
CO2: 25 mmol/L (ref 22–32)
Chloride: 105 mmol/L (ref 101–111)
Creatinine, Ser: 0.63 mg/dL (ref 0.44–1.00)
GLUCOSE: 103 mg/dL — AB (ref 65–99)
Potassium: 3.7 mmol/L (ref 3.5–5.1)
SODIUM: 140 mmol/L (ref 135–145)

## 2018-05-18 LAB — CBC
HCT: 38.2 % (ref 36.0–46.0)
Hemoglobin: 12.3 g/dL (ref 12.0–15.0)
MCH: 27.2 pg (ref 26.0–34.0)
MCHC: 32.2 g/dL (ref 30.0–36.0)
MCV: 84.3 fL (ref 78.0–100.0)
Platelets: 268 10*3/uL (ref 150–400)
RBC: 4.53 MIL/uL (ref 3.87–5.11)
RDW: 13.6 % (ref 11.5–15.5)
WBC: 4.7 10*3/uL (ref 4.0–10.5)

## 2018-05-18 LAB — LIPASE, BLOOD: LIPASE: 28 U/L (ref 11–51)

## 2018-05-18 MED ORDER — ONDANSETRON 4 MG PO TBDP
4.0000 mg | ORAL_TABLET | Freq: Once | ORAL | Status: AC | PRN
  Administered 2018-05-18: 4 mg via ORAL
  Filled 2018-05-18: qty 1
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
## 2018-05-19 ENCOUNTER — Emergency Department (HOSPITAL_COMMUNITY)
Admission: EM | Admit: 2018-05-19 | Discharge: 2018-05-19 | Disposition: A | Discharge status: Home / Self Care | Payer: Managed Care, Other (non HMO) | Attending: Emergency Medicine | Admitting: Emergency Medicine

## 2018-05-19 DIAGNOSIS — F0781 Postconcussional syndrome: Secondary | ICD-10-CM

## 2018-05-19 DIAGNOSIS — S161XXA Strain of muscle, fascia and tendon at neck level, initial encounter: Secondary | ICD-10-CM

## 2018-05-19 HISTORY — DX: Degenerative disease of nervous system, unspecified: G31.9

## 2018-05-19 LAB — URINALYSIS, ROUTINE W REFLEX MICROSCOPIC
BILIRUBIN URINE: NEGATIVE
Glucose, UA: NEGATIVE mg/dL
HGB URINE DIPSTICK: NEGATIVE
Ketones, ur: NEGATIVE mg/dL
Leukocytes, UA: NEGATIVE
Nitrite: NEGATIVE
Protein, ur: NEGATIVE mg/dL
SPECIFIC GRAVITY, URINE: 1.017 (ref 1.005–1.030)
pH: 5 (ref 5.0–8.0)

## 2018-05-19 MED ORDER — IBUPROFEN 600 MG PO TABS: 600.0000 mg | ORAL_TABLET | Freq: Four times a day (QID) | ORAL | 0 refills | Status: AC | PRN
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
## 2018-11-04 DIAGNOSIS — M25561 Pain in right knee: Secondary | ICD-10-CM | POA: Diagnosis not present

## 2018-11-09 ENCOUNTER — Other Ambulatory Visit (HOSPITAL_COMMUNITY): Payer: Self-pay | Admitting: Physical Medicine and Rehabilitation

## 2018-11-09 ENCOUNTER — Ambulatory Visit (HOSPITAL_COMMUNITY)
Admission: RE | Admit: 2018-11-09 | Discharge: 2018-11-09 | Disposition: A | Discharge status: Home / Self Care | Payer: Medicare Other | Source: Ambulatory Visit | Attending: Physical Medicine and Rehabilitation | Admitting: Physical Medicine and Rehabilitation

## 2018-11-09 DIAGNOSIS — M7989 Other specified soft tissue disorders: Secondary | ICD-10-CM | POA: Insufficient documentation

## 2018-11-09 DIAGNOSIS — M79605 Pain in left leg: Secondary | ICD-10-CM

## 2018-11-09 DIAGNOSIS — M79661 Pain in right lower leg: Secondary | ICD-10-CM | POA: Diagnosis not present

## 2018-11-09 DIAGNOSIS — M7121 Synovial cyst of popliteal space [Baker], right knee: Secondary | ICD-10-CM | POA: Insufficient documentation

## 2018-11-09 DIAGNOSIS — M79604 Pain in right leg: Secondary | ICD-10-CM

## 2018-11-09 NOTE — Progress Notes (Addendum)
RLE venous duplex prelim: negative for DVT. Large popliteal cyst noted extending from the popliteal fossa into the mid calf.     Called results to Dr. Regino SchultzeWang. 1:20 PM Waiting on call back ASAP   Attempted to call again- as still waiting on call back 1:40 PM No answer and the line cut off when pressing "call to physician" option. Will let patient leave. Informed her that no one is calling back in a timely manner as I was told they would 1:42 PM Patient is aware of results.   Farrel DemarkJill Eunice, RDMS, RVT

## 2018-11-20 DIAGNOSIS — M25561 Pain in right knee: Secondary | ICD-10-CM | POA: Diagnosis not present

## 2018-11-29 DIAGNOSIS — M25561 Pain in right knee: Secondary | ICD-10-CM | POA: Diagnosis not present
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
## 2020-11-17 ENCOUNTER — Other Ambulatory Visit: Payer: Self-pay | Admitting: Surgery

## 2020-11-17 DIAGNOSIS — E041 Nontoxic single thyroid nodule: Secondary | ICD-10-CM

## 2020-12-15 ENCOUNTER — Ambulatory Visit
Admission: RE | Admit: 2020-12-15 | Discharge: 2020-12-15 | Disposition: A | Discharge status: Home / Self Care | Payer: Self-pay | Source: Ambulatory Visit | Attending: Surgery | Admitting: Surgery

## 2020-12-15 ENCOUNTER — Other Ambulatory Visit: Payer: Self-pay | Admitting: Surgery

## 2020-12-15 DIAGNOSIS — E041 Nontoxic single thyroid nodule: Secondary | ICD-10-CM

## 2020-12-24 ENCOUNTER — Other Ambulatory Visit: Payer: Self-pay | Admitting: Surgery

## 2020-12-24 DIAGNOSIS — E041 Nontoxic single thyroid nodule: Secondary | ICD-10-CM

## 2024-07-19 ENCOUNTER — Other Ambulatory Visit: Payer: Self-pay | Admitting: General Surgery

## 2024-07-19 DIAGNOSIS — E042 Nontoxic multinodular goiter: Secondary | ICD-10-CM
